# Patient Record
Sex: Male | Born: 1980 | Race: Asian | Hispanic: No | Marital: Married | Smoking: Never smoker
Health system: Southern US, Community
[De-identification: ages and names within clinical notes are randomized; demographics above are authoritative.]

## PROBLEM LIST (undated history)

## (undated) HISTORY — PX: RECTAL SURGERY: SHX760

---

## 2017-03-27 ENCOUNTER — Encounter: Payer: Self-pay | Admitting: Family Medicine

## 2017-03-27 ENCOUNTER — Ambulatory Visit (INDEPENDENT_AMBULATORY_CARE_PROVIDER_SITE_OTHER): Payer: 59 | Admitting: Family Medicine

## 2017-03-27 VITALS — BP 100/70 | HR 82 | Ht 68.0 in | Wt 168.0 lb

## 2017-03-27 DIAGNOSIS — Z0000 Encounter for general adult medical examination without abnormal findings: Secondary | ICD-10-CM

## 2017-03-27 NOTE — Patient Instructions (Signed)
Preventive Care 18-39 Years, Male Preventive care refers to lifestyle choices and visits with your health care provider that can promote health and wellness. What does preventive care include?  A yearly physical exam. This is also called an annual well check.  Dental exams once or twice a year.  Routine eye exams. Ask your health care provider how often you should have your eyes checked.  Personal lifestyle choices, including: ? Daily care of your teeth and gums. ? Regular physical activity. ? Eating a healthy diet. ? Avoiding tobacco and drug use. ? Limiting alcohol use. ? Practicing safe sex. What happens during an annual well check? The services and screenings done by your health care provider during your annual well check will depend on your age, overall health, lifestyle risk factors, and family history of disease. Counseling Your health care provider may ask you questions about your:  Alcohol use.  Tobacco use.  Drug use.  Emotional well-being.  Home and relationship well-being.  Sexual activity.  Eating habits.  Work and work environment.  Screening You may have the following tests or measurements:  Height, weight, and BMI.  Blood pressure.  Lipid and cholesterol levels. These may be checked every 5 years starting at age 20.  Diabetes screening. This is done by checking your blood sugar (glucose) after you have not eaten for a while (fasting).  Skin check.  Hepatitis C blood test.  Hepatitis B blood test.  Sexually transmitted disease (STD) testing.  Discuss your test results, treatment options, and if necessary, the need for more tests with your health care provider. Vaccines Your health care provider may recommend certain vaccines, such as:  Influenza vaccine. This is recommended every year.  Tetanus, diphtheria, and acellular pertussis (Tdap, Td) vaccine. You may need a Td booster every 10 years.  Varicella vaccine. You may need this if you  have not been vaccinated.  HPV vaccine. If you are 26 or younger, you may need three doses over 6 months.  Measles, mumps, and rubella (MMR) vaccine. You may need at least one dose of MMR.You may also need a second dose.  Pneumococcal 13-valent conjugate (PCV13) vaccine. You may need this if you have certain conditions and have not been vaccinated.  Pneumococcal polysaccharide (PPSV23) vaccine. You may need one or two doses if you smoke cigarettes or if you have certain conditions.  Meningococcal vaccine. One dose is recommended if you are age 19-21 years and a first-year college student living in a residence hall, or if you have one of several medical conditions. You may also need additional booster doses.  Hepatitis A vaccine. You may need this if you have certain conditions or if you travel or work in places where you may be exposed to hepatitis A.  Hepatitis B vaccine. You may need this if you have certain conditions or if you travel or work in places where you may be exposed to hepatitis B.  Haemophilus influenzae type b (Hib) vaccine. You may need this if you have certain risk factors.  Talk to your health care provider about which screenings and vaccines you need and how often you need them. This information is not intended to replace advice given to you by your health care provider. Make sure you discuss any questions you have with your health care provider. Document Released: 08/05/2001 Document Revised: 02/27/2016 Document Reviewed: 04/10/2015 Elsevier Interactive Patient Education  2017 [employer] Inc[employer]  

## 2017-03-27 NOTE — Progress Notes (Signed)
   Subjective:  Jordan Huber is a 36 y.o. male who presents today for his annual comprehensive physical exam.    HPI:  Jordan Huber has no acute complaints today.   Lifestyle Diet: Balanced. Plenty of fruits and vegetables.  Exercise: Plays tennis and walks regularly.   Depression screen PHQ 2/9 03/27/2017  Decreased Interest 0  Down, Depressed, Hopeless 0  PHQ - 2 Score 0    Health Maintenance Due  Topic Date Due  . HIV Screening  12/06/1994     ROS: Decreased sleep related to his work. Otherwise a 14 point review of systems was performed and was negative  PMH:  The following were reviewed and entered/updated in epic: History reviewed. No pertinent past medical history. There are no active problems to display for this patient.  History reviewed. No pertinent surgical history.  History reviewed. No pertinent family history.  Medications- reviewed and updated No current outpatient prescriptions on file.   No current facility-administered medications for this visit.     Allergies-reviewed and updated Not on File  Social History   Social History  . Marital status: Married    Spouse name: N/A  . Number of children: 1  . Years of education: N/A   Occupational History  .      IT software   Social History Main Topics  . Smoking status: Never Smoker  . Smokeless tobacco: Never Used  . Alcohol use No  . Drug use: No  . Sexual activity: Not Asked   Other Topics Concern  . None   Social History Narrative  . None    Objective:  Physical Exam: BP 100/70   Pulse 82   Ht 5\' 8"  (1.727 m)   Wt 168 lb (76.2 kg)   SpO2 98%   BMI 25.54 kg/m   Body mass index is 25.54 kg/m. Gen: NAD, resting comfortably HEENT: TMs normal bilaterally. OP clear. No thyromegaly noted.  CV: RRR with no murmurs appreciated Pulm: NWOB, CTAB with no crackles, wheezes, or rhonchi GI: Normal bowel sounds present. Soft, Nontender, Nondistended. MSK: no edema, cyanosis, or  clubbing noted Skin: warm, dry Neuro: CN2-12 grossly intact. Strength 5/5 in upper and lower extremities. Reflexes symmetric and intact bilaterally.  Psych: Normal affect and thought content  Assessment/Plan:  Preventative Healthcare: Flu shot deferred today. Patient reports that he recently had his cholesterol levels checked. Asked patient to bring these records in.  Patient Counseling:  -Nutrition: Stressed importance of moderation in sodium/caffeine intake, saturated fat and cholesterol, caloric balance, sufficient intake of fresh fruits, vegetables, and fiber.  -Stressed the importance of regular exercise.   -Substance Abuse: Discussed cessation/primary prevention of tobacco, alcohol, or other drug use; driving or other dangerous activities under the influence; availability of treatment for abuse.   -Injury prevention: Discussed safety belts, safety helmets, smoke detector, smoking near bedding or upholstery.   -Sexuality: Discussed sexually transmitted diseases, partner selection, use of condoms, avoidance of unintended pregnancy and contraceptive alternatives.   -Dental health: Discussed importance of regular tooth brushing, flossing, and dental visits.  -Health maintenance and immunizations reviewed. Please refer to Health maintenance section.  Return to care in 1 year for next preventative visit.   Algis Greenhouse. Jerline Pain, MD 03/27/2017 2:50 PM

## 2018-03-05 ENCOUNTER — Encounter: Payer: Self-pay | Admitting: Family Medicine

## 2018-03-05 ENCOUNTER — Ambulatory Visit (INDEPENDENT_AMBULATORY_CARE_PROVIDER_SITE_OTHER): Payer: 59 | Admitting: Family Medicine

## 2018-03-05 VITALS — BP 108/60 | HR 72 | Temp 98.3°F | Ht 68.0 in | Wt 172.4 lb

## 2018-03-05 DIAGNOSIS — S29011A Strain of muscle and tendon of front wall of thorax, initial encounter: Secondary | ICD-10-CM

## 2018-03-05 MED ORDER — MELOXICAM 15 MG PO TABS: 15.0000 mg | ORAL_TABLET | Freq: Every day | ORAL | 0 refills | Status: DC

## 2018-03-05 NOTE — Patient Instructions (Signed)
Take mobic once/day for the first 3 days then as needed. Continue with heating pads. If not getting better please let me know on Monday so I can have our sports medicine doctor see you.    Muscle Strain A muscle strain (pulled muscle) happens when a muscle is stretched beyond normal length. It happens when a sudden, violent force stretches your muscle too far. Usually, a few of the fibers in your muscle are torn. Muscle strain is common in athletes. Recovery usually takes 1-2 weeks. Complete healing takes 5-6 weeks. Follow these instructions at home:  Follow the PRICE method of treatment to help your injury get better. Do this the first 2-3 days after the injury: ? Protect. Protect the muscle to keep it from getting injured again. ? Rest. Limit your activity and rest the injured body part. ? Ice. Put ice in a plastic bag. Place a towel between your skin and the bag. Then, apply the ice and leave it on from 15-20 minutes each hour. After the third day, switch to moist heat packs. ? Compression. Use a splint or elastic bandage on the injured area for comfort. Do not put it on too tightly. ? Elevate. Keep the injured body part above the level of your heart.  Only take medicine as told by your doctor.  Warm up before doing exercise to prevent future muscle strains. Contact a doctor if:  You have more pain or puffiness (swelling) in the injured area.  You feel numbness, tingling, or notice a loss of strength in the injured area. This information is not intended to replace advice given to you by your health care provider. Make sure you discuss any questions you have with your health care provider. Document Released: 03/18/2008 Document Revised: 11/15/2015 Document Reviewed: 01/06/2013 Elsevier Interactive Patient Education  2017 [employer].

## 2018-03-05 NOTE — Progress Notes (Signed)
Patient: Jordan Huber MRN: [id number] DOB: 08-23-1980 PCP: Vivi Barrack, MD     Subjective:  Chief Complaint  Patient presents with  . left shoulder pain    x 3 days    HPI: The patient is a 37 y.o. male who presents today for left shoulder pain that started 3 days ago. He does not recall any precipitating factor. He denies any heavy lifting and uses treadmill, but nothing out of the ordinary. He works in Engineer, technical sales. He is right handed. Pain is over anterior left shoulder and superior aspect of shoulder joint with some radiation into his pec. Pain described as stabbing with movement and sharp. He states it has gotten better over the past 2 days. No otc medication has been tried. He states a heating pad relieved the pain. Lifting and turning the shoulder make it worse.   Review of Systems  Respiratory: Negative for shortness of breath.   Cardiovascular: Negative for chest pain.  Musculoskeletal: Positive for arthralgias and myalgias.       Left shoulder pain no h/o injury  Left pectoral muscle sore     Allergies Patient has No Known Allergies.  Past Medical History Patient  has no past medical history on file.  Surgical History Patient  has no past surgical history on file.  Family History Pateint's family history is not on file.  Social History Patient  reports that he has never smoked. He has never used smokeless tobacco. He reports that he does not drink alcohol or use drugs.    Objective: Vitals:   03/05/18 0821  BP: 108/60  Pulse: 72  Temp: 98.3 F (36.8 C)  TempSrc: Oral  SpO2: 97%  Weight: 172 lb 6.4 oz (78.2 kg)  Height: 5\' 8"  (1.727 m)    Body mass index is 26.21 kg/m.  Physical Exam  Constitutional: He appears well-developed and well-nourished.  Neck: Normal range of motion. Neck supple.  Cardiovascular: Normal rate, regular rhythm and normal heart sounds.  Pulmonary/Chest: Effort normal and breath sounds normal.  Musculoskeletal:  TTP over lateral  aspect of left pectoralis and into proximal/superior aspect of left humerus just adjacent to shoulder joint. Negative neer's sign. Pain in previously mentioned places with gerber test. Some pain with passive arc test. ROM intact. No erythema/edema.   Vitals reviewed.      Assessment/plan: 1. Pectoralis muscle strain, initial encounter Appears to have a pectoralis strain. RICe/conservative therapy. We are going to do scheduled mobic for next 3-5 days then prn. Continue with heat. No heavy lifting. If not making significant progress by next week he is to let us know so we can get him in to see sports med.     Return if symptoms worsen or fail to improve.    Orma Flaming, MD [place]   03/05/2018

## 2018-10-12 ENCOUNTER — Ambulatory Visit (INDEPENDENT_AMBULATORY_CARE_PROVIDER_SITE_OTHER): Payer: 59 | Admitting: Family Medicine

## 2018-10-12 ENCOUNTER — Encounter: Payer: Self-pay | Admitting: Family Medicine

## 2018-10-12 DIAGNOSIS — L0291 Cutaneous abscess, unspecified: Secondary | ICD-10-CM

## 2018-10-12 MED ORDER — DOXYCYCLINE HYCLATE 100 MG PO TABS: 100.0000 mg | ORAL_TABLET | Freq: Two times a day (BID) | ORAL | 0 refills | Status: DC

## 2018-10-12 NOTE — Progress Notes (Signed)
    Chief Complaint:  Jordan Huber is a 38 y.o. male who presents today for a virtual office visit with a chief complaint of Skin Infection.   Assessment/Plan:  Abscess No red flags.  No signs of Fournier's gangrene or systemic infection.  Will start doxycycline 100 mg twice daily x7 days.  Continue warm compresses.  Encourage good oral hydration.  Discussed warning signs and reasons to return to care.  Follow-up as needed.    Subjective:  HPI:  Skin Infection  Started about a week ago. Located near gluteal area.  Has tried using warm baths and hot water with some improvement.  Charted having bloody and purulent drainage 2 days ago and has had some improvement in pain since then.  Symptoms again worsened this morning with pain and purulent drainage.  No fevers or chills.  No other treatments tried.  No body aches, nausea, or vomiting.  No urinary symptoms.  No other obvious alleviating or aggravating factors.  ROS: Per HPI  PMH: He reports that he has never smoked. He has never used smokeless tobacco. He reports that he does not drink alcohol or use drugs.      Objective/Observations  Physical Exam: Gen: NAD, resting comfortably Pulm: Normal work of breathing Neuro: Grossly normal, moves all extremities Psych: Normal affect and thought content GU: Erythematous area approximately 2 cm in diameter on left gluteal cleft.  Virtual Visit via Video   I connected with Ravidabu Ruppe on 10/12/18 at  4:00 PM EDT by a video enabled telemedicine application and verified that I am speaking with the correct person using two identifiers. I discussed the limitations of evaluation and management by telemedicine and the availability of in person appointments. The patient expressed understanding and agreed to proceed.   Patient location: Home Provider location: [place] participating in the virtual visit: Myself and Patient     Algis Greenhouse. Jerline Pain, MD 10/12/2018 3:36  PM

## 2018-11-04 ENCOUNTER — Ambulatory Visit (INDEPENDENT_AMBULATORY_CARE_PROVIDER_SITE_OTHER): Payer: 59 | Admitting: Family Medicine

## 2018-11-04 ENCOUNTER — Encounter: Payer: Self-pay | Admitting: Family Medicine

## 2018-11-04 DIAGNOSIS — L0291 Cutaneous abscess, unspecified: Secondary | ICD-10-CM

## 2018-11-04 MED ORDER — DOXYCYCLINE HYCLATE 100 MG PO TABS: 100.0000 mg | ORAL_TABLET | Freq: Two times a day (BID) | ORAL | 0 refills | Status: DC

## 2018-11-04 NOTE — Progress Notes (Signed)
    Chief Complaint:  Jordan Huber is a 38 y.o. male who presents today for a virtual office visit with a chief complaint of abscess.   Assessment/Plan:  Perirectal Abscess No red flags.  Concern for rectal involvement given that he has had some purulent drainage per rectum.  Will restart doxycycline 100 mg twice daily for 14 days.  Will place urgent referral to surgery.  Discussed reasons to return to care and seek emergent care.    Subjective:  HPI:  Abscess Patient was treated with a course of antibiotics about a month ago.  He had some improvement in symptoms however over the last few days has noticed recurrence of drainage from the area.  Also has noted some purulent rectal drainage as well.  No fevers or chills.  No pain.  No body aches.  Some increased lethargy and malaise.  No other treatments tried. No other obvious alleviating or aggravating factors.   ROS: Per HPI  PMH: He reports that he has never smoked. He has never used smokeless tobacco. He reports that he does not drink alcohol or use drugs.      Objective/Observations  Physical Exam: Gen: NAD, resting comfortably Pulm: Normal work of breathing Neuro: Grossly normal, moves all extremities Psych: Normal affect and thought content GU: Erythematous area approximately 2cm in diameter on right anal verge. Open with some purulent drainage.    Virtual Visit via Video   I connected with Jordan Huber on 11/04/18 at  2:20 PM EDT by a video enabled telemedicine application and verified that I am speaking with the correct person using two identifiers. I discussed the limitations of evaluation and management by telemedicine and the availability of in person appointments. The patient expressed understanding and agreed to proceed.   Patient location: Home Provider location: [place] participating in the virtual visit: Myself and Patient     Algis Greenhouse. Jerline Pain, MD 11/04/2018 2:39 PM

## 2019-01-04 ENCOUNTER — Encounter: Payer: Self-pay | Admitting: Physician Assistant

## 2019-01-04 ENCOUNTER — Ambulatory Visit (INDEPENDENT_AMBULATORY_CARE_PROVIDER_SITE_OTHER): Payer: 59 | Admitting: Physician Assistant

## 2019-01-04 ENCOUNTER — Other Ambulatory Visit: Payer: Self-pay

## 2019-01-04 VITALS — BP 100/60 | HR 95 | Temp 98.6°F | Ht 68.0 in | Wt 171.0 lb

## 2019-01-04 DIAGNOSIS — L0291 Cutaneous abscess, unspecified: Secondary | ICD-10-CM | POA: Diagnosis not present

## 2019-01-04 MED ORDER — DOXYCYCLINE HYCLATE 100 MG PO TABS: 100.0000 mg | ORAL_TABLET | Freq: Two times a day (BID) | ORAL | 0 refills | Status: DC

## 2019-01-04 NOTE — Progress Notes (Signed)
Jordan Huber is a 38 y.o. male here for a follow up of a pre-existing problem.  History of Present Illness:   Chief Complaint  Patient presents with  . Follow-up    boil, x3 months/ Rt side of rectum    HPI   Abscess Peri-rectal abscess causing intermittent issues x 3-4 months. He has been on two rounds of antibiotics and it seems to improve after each round. He states that a referral for surgery was recommended by his PCP but he was never contacted for this. He didn't really worry about it because his symptoms greatly improved after his last visit. He states that 2-3 days ago however his symptoms returned. He is having some issues with purulent drainage and pain to the location. Denies: fevers, chills, rectal bleeding.  History reviewed. No pertinent past medical history.   Social History   Socioeconomic History  . Marital status: Married    Spouse name: Not on file  . Number of children: 1  . Years of education: Not on file  . Highest education level: Not on file  Occupational History    Comment: IT software  Social Needs  . Financial resource strain: Not on file  . Food insecurity    Worry: Not on file    Inability: Not on file  . Transportation needs    Medical: Not on file    Non-medical: Not on file  Tobacco Use  . Smoking status: Never Smoker  . Smokeless tobacco: Never Used  Substance and Sexual Activity  . Alcohol use: No  . Drug use: No  . Sexual activity: Not on file  Lifestyle  . Physical activity    Days per week: Not on file    Minutes per session: Not on file  . Stress: Not on file  Relationships  . Social Herbalist on phone: Not on file    Gets together: Not on file    Attends religious service: Not on file    Active member of club or organization: Not on file    Attends meetings of clubs or organizations: Not on file    Relationship status: Not on file  . Intimate partner violence    Fear of current or ex partner: Not on file   Emotionally abused: Not on file    Physically abused: Not on file    Forced sexual activity: Not on file  Other Topics Concern  . Not on file  Social History Narrative  . Not on file    History reviewed. No pertinent surgical history.  History reviewed. No pertinent family history.  No Known Allergies  Current Medications:   Current Outpatient Medications:  .  doxycycline (VIBRA-TABS) 100 MG tablet, Take 1 tablet (100 mg total) by mouth 2 (two) times daily., Disp: 28 tablet, Rfl: 0   Review of Systems:   ROS  Negative unless otherwise specified per HPI.  Vitals:   Vitals:   01/04/19 0948  BP: 100/60  Pulse: 95  Temp: 98.6 F (37 C)  TempSrc: Oral  SpO2: 97%  Weight: 171 lb (77.6 kg)  Height: 5\' 8"  (1.727 m)     Body mass index is 26 kg/m.  Physical Exam:   Physical Exam Vitals signs and nursing note reviewed.  Constitutional:      General: He is not in acute distress.    Appearance: He is well-developed. He is not ill-appearing or toxic-appearing.  Cardiovascular:     Rate and Rhythm: Normal rate  and regular rhythm.     Pulses: Normal pulses.     Heart sounds: Normal heart sounds, S1 normal and S2 normal.     Comments: No LE edema Pulmonary:     Effort: Pulmonary effort is normal.     Breath sounds: Normal breath sounds.  Genitourinary:   Skin:    General: Skin is warm and dry.  Neurological:     Mental Status: He is alert.     GCS: GCS eye subscore is 4. GCS verbal subscore is 5. GCS motor subscore is 6.  Psychiatric:        Speech: Speech normal.        Behavior: Behavior normal. Behavior is cooperative.     Assessment and Plan:   Jordan Huber was seen today for follow-up.  Diagnoses and all orders for this visit:  Abscess  Other orders -     doxycycline (VIBRA-TABS) 100 MG tablet; Take 1 tablet (100 mg total) by mouth 2 (two) times daily.   Doxycycline prescribed due to recent recurrence of symptoms. Referral put in by PCP at prior  visit, instructed patient to call [facility], phone number provided. I recommended that he reach out to our office if he has difficulty scheduling appointment and we will assist as able. Worsening precautions advised.  . Reviewed expectations re: course of current medical issues. . Discussed self-management of symptoms. . Outlined signs and symptoms indicating need for more acute intervention. . Patient verbalized understanding and all questions were answered. . See orders for this visit as documented in the electronic medical record. . Patient received an After-Visit Summary.  Inda Coke, PA-C

## 2019-01-04 NOTE — Patient Instructions (Signed)
Start oral doxycycline antibiotic.  Please call [facility] to schedule an appointment: [phone].  If you have difficulty getting an appointment, please call us and let us know and we will try to help.   Anorectal Abscess An abscess is an infected area that contains a collection of pus. An anorectal abscess is an abscess that is near the opening of the anus or around the rectum. Without treatment, an anorectal abscess can become larger and cause other problems, such as a more serious body-wide infection or pain, especially during bowel movements. What are the causes? This condition is caused by plugged glands or an infection in one of these areas:  The anus.  The area between the anus and the scrotum in males or between the anus and the vagina in females (perineum). What increases the risk? The following factors may make you more likely to develop this condition:  Diabetes or inflammatory bowel disease.  Having a body defense system (immune system) that is weak.  Engaging in anal sex.  Having a sexually transmitted infection (STI).  Certain kinds of cancer, such as rectal carcinoma, leukemia, or lymphoma. What are the signs or symptoms? The main symptom of this condition is pain. The pain may be a throbbing pain that gets worse during bowel movements. Other symptoms include:  Swelling and redness in the area of the abscess. The redness may go beyond the abscess and appear as a red streak on the skin.  A visible, painful lump, or a lump that can be felt when touched.  Bleeding or pus-like discharge from the area.  Fever.  General weakness.  Constipation.  Diarrhea. How is this diagnosed? This condition is diagnosed based on your medical history and a physical exam of the affected area.  This may involve examining the rectal area with a gloved hand (digital rectal exam).  Sometimes, the health care provider needs to look into the rectum using a probe,  scope, or imaging test.  For women, it may require a careful vaginal exam. How is this treated? Treatment for this condition may include:  Incision and drainage surgery. This involves making an incision over the abscess to drain the pus.  Medicines, including antibiotic medicine, pain medicine, stool softeners, or laxatives. Follow these instructions at home: Medicines  Take over-the-counter and prescription medicines only as told by your health care provider.  If you were prescribed an antibiotic medicine, use it as told by your health care provider. Do not stop using the antibiotic even if you start to feel better.  Do not drive or use heavy machinery while taking prescription pain medicine. Wound care   If gauze was used in the abscess, follow instructions from your health care provider about removing or changing the gauze. It can usually be removed in 2-3 days.  Wash your hands with soap and water before you remove or change your gauze. If soap and water are not available, use hand sanitizer.  If one or more drains were placed in the abscess cavity, be careful not to pull at them. Your health care provider will tell you how long they need to remain in place.  Check your incision area every day for signs of infection. Check for: ? More redness, swelling, or pain. ? More fluid or blood. ? Warmth. ? Pus or a bad smell. Managing pain, stiffness, and swelling   Take a sitz bath 3-4 times a day and after bowel movements. This will help reduce pain and swelling.  To  relieve pain, try sitting: ? On a heating pad with the setting on low. ? On an inflatable donut-shaped cushion.  If directed, put ice on the affected area: ? Put ice in a plastic bag. ? Place a towel between your skin and the bag. ? Leave the ice on for 20 minutes, 2-3 times a day. General instructions  Follow any diet instructions given by your health care provider.  Keep all follow-up visits as told by your  health care provider. This is important. Contact a health care provider if you have:  Bleeding from your incision.  Pain, swelling, or redness that does not improve or gets worse.  Trouble passing stool or urine.  Symptoms that return after treatment. Get help right away if you:  Have problems moving or using your legs.  Have severe or increasing pain.  Have swelling in the affected area that suddenly gets worse.  Have a large increase in bleeding or passing of pus.  Develop chills or a fever. Summary  An anorectal abscess is an abscess that is near the opening of the anus or around the rectum. An abscess is an infected area that contains a collection of pus.  The main symptom of this condition is pain. It may be a throbbing pain that gets worse during bowel movements.  Treatment for an anorectal abscess may include surgery to drain the pus from the abscess. Medicines and sitz baths may also be a part of your treatment plan. This information is not intended to replace advice given to you by your health care provider. Make sure you discuss any questions you have with your health care provider. Document Released: 06/06/2000 Document Revised: 07/16/2017 Document Reviewed: 07/16/2017 Elsevier Patient Education  2020 [employer].

## 2019-01-11 ENCOUNTER — Ambulatory Visit: Payer: Self-pay | Admitting: Surgery

## 2019-01-11 ENCOUNTER — Encounter: Payer: Self-pay | Admitting: Surgery

## 2019-01-11 DIAGNOSIS — K603 Anal fistula: Secondary | ICD-10-CM | POA: Insufficient documentation

## 2019-01-11 NOTE — H&P (Signed)
Jordan Huber Documented: 01/11/2019 8:46 AM Location: [facility] Patient #: [id number] DOB: 1981-01-10 Married / Language: English / Race: Asian Male   History of Present Illness Jordan Hector MD; 01/11/2019 10:23 AM) The patient is a 38 year old male who presents with anal fistula. Note for "Anal fistula": ` ` ` ` ` ` Patient sent for surgical consultation at the request of Inda Coke, [place],  Chief Complaint: Recurrent perirectal abscesses. Probable fistula. ` ` The patient is a young male the gets recurrent boils near his anus. It happens every 3-4 months. Usually he gets antibiotics and it goes away. However they keep coming back. He's been to the primary care office in April, May, and last week. Discuss with his primary care office. Surgical consultation offered. Usually moves his bowels once a day. Sometimes twice. With these flares he's had up to 4 bowel movements a day, especially on the Augmentin antibiotic. He's never had a colonoscopy. Usually walks about 45 minutes in the morning. Does not smoke. He is not diabetic. No hypertension. [place].  No personal nor family history of GI/colon cancer, inflammatory bowel disease, irritable bowel syndrome, allergy such as Celiac Sprue, dietary/dairy problems, colitis, ulcers nor gastritis. No recent sick contacts/gastroenteritis. No travel outside the country. No changes in diet. No dysphagia to solids or liquids. No significant heartburn or reflux. No hematochezia, hematemesis, coffee ground emesis. No evidence of prior gastric/peptic ulceration.  (Review of systems as stated in this history (HPI) or in the review of systems. Otherwise all other 12 point ROS are negative) ` ` `   Past Surgical History Nance Pew, CMA; 01/11/2019 8:46 AM) No pertinent past surgical history   Diagnostic Studies History Nance Pew, CMA; 01/11/2019 8:46 AM) Colonoscopy   never  Allergies ([place], CMA; 01/11/2019 8:47 AM) No Known Drug Allergies  [01/04/2019]: Allergies Reconciled   Medication History Nance Pew, CMA; 01/11/2019 8:48 AM) Medications Reconciled Amoxicillin-Pot Clavulanate (875MG  Tablet, Oral) Active.  Social History Gabriel Cirri [place], CMA; 01/11/2019 8:46 AM) Caffeine use  Tea. No alcohol use  No drug use  Tobacco use  Never smoker.  Family History Nance Pew, [place]; 01/11/2019 8:46 AM) First Degree Relatives  No pertinent family history   Other Problems Nance Pew, Jordan; 01/11/2019 8:46 AM) No pertinent past medical history     Review of Systems Hunt Oris; 01/11/2019 8:50 AM) General Not Present- Appetite Loss, Chills, Fatigue, Fever, Night Sweats, Weight Gain and Weight Loss. Skin Not Present- Change in Wart/Mole, Dryness, Hives, Jaundice, New Lesions, Non-Healing Wounds, Rash and Ulcer. HEENT Not Present- Earache, Hearing Loss, Hoarseness, Nose Bleed, Oral Ulcers, Ringing in the Ears, Seasonal Allergies, Sinus Pain, Sore Throat, Visual Disturbances, Wears glasses/contact lenses and Yellow Eyes. Respiratory Not Present- Bloody sputum, Chronic Cough, Difficulty Breathing, Snoring and Wheezing. Breast Not Present- Breast Mass, Breast Pain, Nipple Discharge and Skin Changes. Cardiovascular Not Present- Chest Pain, Difficulty Breathing Lying Down, Leg Cramps, Palpitations, Rapid Heart Rate, Shortness of Breath and Swelling of Extremities. Gastrointestinal Present- Bloating and Excessive gas. Not Present- Abdominal Pain, Bloody Stool, Change in Bowel Habits, Chronic diarrhea, Constipation, Difficulty Swallowing, Gets full quickly at meals, Hemorrhoids, Indigestion, Nausea, Rectal Pain and Vomiting. Male Genitourinary Not Present- Blood in Urine, Change in Urinary Stream, Frequency, Impotence, Nocturia, Painful Urination, Urgency and Urine Leakage. Musculoskeletal Not Present- Back Pain, Joint Pain, Joint Stiffness,  Muscle Pain, Muscle Weakness and Swelling of Extremities. Neurological Not Present- Decreased Memory, Fainting, Headaches, Numbness, Seizures, Tingling, Tremor, Trouble  walking and Weakness. Psychiatric Not Present- Anxiety, Bipolar, Change in Sleep Pattern, Depression, Fearful and Frequent crying. Endocrine Not Present- Cold Intolerance, Excessive Hunger, Hair Changes, Heat Intolerance, Hot flashes and New Diabetes. Hematology Not Present- Blood Thinners, Easy Bruising, Excessive bleeding, Gland problems, HIV and Persistent Infections.  Vitals (Sabrina Canty CMA; 01/11/2019 8:48 AM) 01/11/2019 8:48 AM Weight: 173.8 lb Height: 68in Body Surface Area: 1.93 m Body Mass Index: 26.43 kg/m  Temp.: 97.72F (Oral)  Pulse: 98 (Regular)  BP: 142/68(Sitting, Left Arm, Standard)       Physical Exam Jordan Hector MD; 01/11/2019 9:00 AM) General Mental Status-Alert. General Appearance-Not in acute distress, Not Sickly. Orientation-Oriented X3. Hydration-Well hydrated. Voice-Normal.  Integumentary Global Assessment Upon inspection and palpation of skin surfaces of the - Axillae: non-tender, no inflammation or ulceration, no drainage. and Distribution of scalp and body hair is normal. General Characteristics Temperature - normal warmth is noted.  Head and Neck Head-normocephalic, atraumatic with no lesions or palpable masses. Face Global Assessment - atraumatic, no absence of expression. Neck Global Assessment - no abnormal movements, no bruit auscultated on the right, no bruit auscultated on the left, no decreased range of motion, non-tender. Trachea-midline. Thyroid Gland Characteristics - non-tender.  Eye Eyeball - Left-Extraocular movements intact, No Nystagmus - Left. Eyeball - Right-Extraocular movements intact, No Nystagmus - Right. Cornea - Left-No Hazy - Left. Cornea - Right-No Hazy - Right. Sclera/Conjunctiva - Left-No scleral icterus, No  Discharge - Left. Sclera/Conjunctiva - Right-No scleral icterus, No Discharge - Right. Pupil - Left-Direct reaction to light normal. Pupil - Right-Direct reaction to light normal.  ENMT Ears Pinna - Left - no drainage observed, no generalized tenderness observed. Pinna - Right - no drainage observed, no generalized tenderness observed. Nose and Sinuses External Inspection of the Nose - no destructive lesion observed. Inspection of the nares - Left - quiet respiration. Inspection of the nares - Right - quiet respiration. Mouth and Throat Lips - Upper Lip - no fissures observed, no pallor noted. Lower Lip - no fissures observed, no pallor noted. Nasopharynx - no discharge present. Oral Cavity/Oropharynx - Tongue - no dryness observed. Oral Mucosa - no cyanosis observed. Hypopharynx - no evidence of airway distress observed.  Chest and Lung Exam Inspection Movements - Normal and Symmetrical. Accessory muscles - No use of accessory muscles in breathing. Palpation Palpation of the chest reveals - Non-tender. Auscultation Breath sounds - Normal and Clear.  Cardiovascular Auscultation Rhythm - Regular. Murmurs & Other Heart Sounds - Auscultation of the heart reveals - No Murmurs and No Systolic Clicks.  Abdomen Inspection Inspection of the abdomen reveals - No Visible peristalsis and No Abnormal pulsations. Umbilicus - No Bleeding, No Urine drainage. Palpation/Percussion Palpation and Percussion of the abdomen reveal - Soft, Non Tender, No Rebound tenderness, No Rigidity (guarding) and No Cutaneous hyperesthesia. Note: Abdomen soft. Nontender. Not distended. No umbilical or incisional hernias. No guarding.   Male Genitourinary Sexual Maturity Tanner 5 - Adult hair pattern and Adult penile size and shape. Note: No inguinal hernias. Normal external genitalia. Epididymi, testes, and spermatic cords normal without any masses.   Rectal Note: Please refer to anoscopy  section.  Posterior midline and left posterior lateral sinuses to-3 cm from anal verge suspicious or perirectal fistula. Definite cord felt to the posterior sphincter complex. Tolerated digital and anoscopic exam. No fissure. No active abscess. Grade 2 internal hemorrhoids right posterior and right anterior. Left lateral rate 1. No tumor mass. Prostate smooth. No proctitis. No pilonidal disease.  No severe pruritus.   Peripheral Vascular Upper Extremity Inspection - Left - No Cyanotic nailbeds - Left, Not Ischemic. Inspection - Right - No Cyanotic nailbeds - Right, Not Ischemic.  Neurologic Neurologic evaluation reveals -normal attention span and ability to concentrate, able to name objects and repeat phrases. Appropriate fund of knowledge , normal sensation and normal coordination. Mental Status Affect - not angry, not paranoid. Cranial Nerves-Normal Bilaterally. Gait-Normal.  Neuropsychiatric Mental status exam performed with findings of-able to articulate well with normal speech/language, rate, volume and coherence, thought content normal with ability to perform basic computations and apply abstract reasoning and no evidence of hallucinations, delusions, obsessions or homicidal/suicidal ideation.  Musculoskeletal Global Assessment Spine, Ribs and Pelvis - no instability, subluxation or laxity. Right Upper Extremity - no instability, subluxation or laxity.  Lymphatic Head & Neck  General Head & Neck Lymphatics: Bilateral - Description - No Localized lymphadenopathy. Axillary  General Axillary Region: Bilateral - Description - No Localized lymphadenopathy. Femoral & Inguinal  Generalized Femoral & Inguinal Lymphatics: Left - Description - No Localized lymphadenopathy. Right - Description - No Localized lymphadenopathy.   Results Jordan Hector MD; 01/11/2019 9:13 AM) Procedures  Name Value Date Hemorrhoids Procedure Internal exam: Internal Hemorroids (  non-bleeding) Other: Posterior midline and left posterior lateral sinuses 2-3 cm from anal verge suspicious or perirectal fistula. Definite cord felt to the posterior sphincter complex. ......  ...... Tolerated digital and anoscopic exam. No fissure. No active abscess. Grade 2 internal hemorrhoids right posterior and right anterior. Left lateral rate 1. No tumor mass. Prostate smooth. No proctitis. No pilonidal disease. No severe pruritus.  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 I suspect this is at least intersphincteric is a does not feel very superficial.  Recommended examination under anesthesia with surgical repair of the fistula. Expect I will need to do a lift approach. Seton less likely. He does have 2 sinuses most likely a and excised the whole region. My suspicion for Crohn's is rather low in him at this time. No family history.  He has numerous. Appropriate questions. He is interested in proceeding.  Current Plans You are being scheduled for surgery- Our schedulers will call you.  You should hear from our office's scheduling department within 5 working days about the location, date, and time of surgery. We try to make accommodations for patient's preferences in scheduling surgery, but sometimes the OR schedule or the surgeon's schedule prevents [place] from making those accommodations.  If you have not heard from our office [phone]) in 5 working days, call the office and ask for your surgeon's nurse.  If you have other questions about your diagnosis, plan, or surgery, call the office and ask for your surgeon's nurse.  Pt Education - [facility] Abscess/Fistula (AT): discussed with patient and provided information. ANOSCOPY, DIAGNOSTIC (46600)  ENCOUNTER FOR PREOPERATIVE EXAMINATION FOR GENERAL SURGICAL  PROCEDURE (B34.193)  Current Plans The anatomy & physiology of the anorectal region was discussed. We discussed the pathophysiology of anorectal abscess and fistula. Differential diagnosis was discussed. Natural history progression was discussed. I stressed the importance of a bowel regimen to have daily soft bowel movements to minimize progression of disease.  The patient's condition is not adequately controlled. Non-operative treatment has not healed the fistula. Therefore, I recommended examination under anaesthesia to confirm the diagnosis and treat the fistula. I discussed  techniques that may be required such as fistulotomy, ligation by LIFT technique, and/or seton placement. Benefits & alternatives discussed. I noted a good likelihood this will help address the problem, but sometimes repeat operations and prolonged healing times may occur. Risks such as bleeding, pain, recurrence, reoperation, incontinence, heart attack, death, and other risks were discussed.  Educational handouts further explaining the pathology, treatment options, and bowel regimen were given. The patient expressed understanding & wishes to proceed. We will work to coordinate surgery for a mutually convenient time.  Pt Education - [facility] Rectal Prep for Anorectal outpatient/office surgery: discussed with patient and provided information. Pt Education - [facility] Rectal Surgery HCI (Calleen Alvis): discussed with patient and provided information. Pt Education - [facility] Good Bowel Health ([employer] [place])  Jordan Hector, MD, FACS, MASCRS Gastrointestinal and Minimally Invasive Surgery    [address] [place], [place] [address] [phone] Main / Paging [phone] Fax

## 2019-01-19 ENCOUNTER — Telehealth: Payer: Self-pay | Admitting: Physical Therapy

## 2019-01-19 NOTE — Telephone Encounter (Signed)
Copied from [place] [phone]. Topic: General - Other >> Jan 19, 2019 12:47 PM Richardo Priest, NT wrote: Patient would like to have a call back today in regards to getting a second opinion of him having surgery. He is wanting his surgery to be sooner than September. Please advise and Call back is [phone].

## 2019-01-20 NOTE — Telephone Encounter (Signed)
Jordan Huber, please see message.

## 2019-01-20 NOTE — Telephone Encounter (Signed)
Spoke to patient - he would like a referral for [facility] Surgery for a second opinion.  He was hard to understand, but he mentioned that he didn't details/information that he wanted.  I did tell him to call [facility] and they can answer any questions he may have. He would also like the surgery sooner than Sept - I did explain that with the second referral, his surgery would definitely not be any sooner.  I am still confused as to why exactly he wants the second opinion, he wouldn't go into a lot of detail, but he is adamant about wanting the second opinion.  He is aware that Dr Jerline Pain is out of the office this week.

## 2019-01-24 ENCOUNTER — Other Ambulatory Visit: Payer: Self-pay

## 2019-01-24 DIAGNOSIS — L0291 Cutaneous abscess, unspecified: Secondary | ICD-10-CM

## 2019-01-24 DIAGNOSIS — K603 Anal fistula: Secondary | ICD-10-CM

## 2019-01-24 NOTE — Telephone Encounter (Signed)
Urgent referral to [facility] has been placed.

## 2019-02-01 ENCOUNTER — Telehealth: Payer: Self-pay

## 2019-02-01 NOTE — Telephone Encounter (Signed)
Noted  

## 2019-02-01 NOTE — Telephone Encounter (Signed)
Copied from [place] [phone]. Topic: Quick Communication - See Telephone Encounter >> Feb 01, 2019 11:04 AM Loma [place] wrote: CRM for notification. See Telephone encounter for: 02/01/19. Pt wanted to let DrParker know that he is going to let [facility] do his surgery and that he does not need a referral, will let him know the outcome.

## 2019-06-14 ENCOUNTER — Ambulatory Visit (INDEPENDENT_AMBULATORY_CARE_PROVIDER_SITE_OTHER): Payer: Managed Care, Other (non HMO) | Admitting: Family Medicine

## 2019-06-14 ENCOUNTER — Other Ambulatory Visit: Payer: Self-pay

## 2019-06-14 ENCOUNTER — Encounter: Payer: Self-pay | Admitting: Family Medicine

## 2019-06-14 VITALS — BP 106/68 | HR 67 | Temp 98.0°F | Ht 68.0 in | Wt 156.8 lb

## 2019-06-14 DIAGNOSIS — K603 Anal fistula: Secondary | ICD-10-CM

## 2019-06-14 DIAGNOSIS — Z13220 Encounter for screening for lipoid disorders: Secondary | ICD-10-CM | POA: Diagnosis not present

## 2019-06-14 DIAGNOSIS — Z0001 Encounter for general adult medical examination with abnormal findings: Secondary | ICD-10-CM | POA: Diagnosis not present

## 2019-06-14 LAB — COMPREHENSIVE METABOLIC PANEL
ALT: 15 U/L (ref 0–53)
AST: 17 U/L (ref 0–37)
Albumin: 4.5 g/dL (ref 3.5–5.2)
Alkaline Phosphatase: 82 U/L (ref 39–117)
BUN: 18 mg/dL (ref 6–23)
CO2: 29 mEq/L (ref 19–32)
Calcium: 9.1 mg/dL (ref 8.4–10.5)
Chloride: 103 mEq/L (ref 96–112)
Creatinine, Ser: 0.72 mg/dL (ref 0.40–1.50)
GFR: 121.2 mL/min (ref >60.00)
Glucose, Bld: 85 mg/dL (ref 70–99)
Potassium: 4.2 mEq/L (ref 3.5–5.1)
Sodium: 139 mEq/L (ref 135–145)
Total Bilirubin: 0.4 mg/dL (ref 0.2–1.2)
Total Protein: 6.8 g/dL (ref 6.0–8.3)

## 2019-06-14 LAB — TSH: TSH: 1.29 u[IU]/mL (ref 0.35–4.50)

## 2019-06-14 LAB — LIPID PANEL
Cholesterol: 189 mg/dL (ref 0–200)
HDL: 34.9 mg/dL — ABNORMAL LOW (ref >39.00)
NonHDL: 153.72
Total CHOL/HDL Ratio: 5
Triglycerides: 238 mg/dL — ABNORMAL HIGH (ref 0.0–149.0)
VLDL: 47.6 mg/dL — ABNORMAL HIGH (ref 0.0–40.0)

## 2019-06-14 LAB — CBC
HCT: 41.8 % (ref 39.0–52.0)
Hemoglobin: 14.2 g/dL (ref 13.0–17.0)
MCHC: 34 g/dL (ref 30.0–36.0)
MCV: 84.4 fl (ref 78.0–100.0)
Platelets: 229 10*3/uL (ref 150.0–400.0)
RBC: 4.95 Mil/uL (ref 4.22–5.81)
RDW: 13.4 % (ref 11.5–15.5)
WBC: 5 10*3/uL (ref 4.0–10.5)

## 2019-06-14 LAB — LDL CHOLESTEROL, DIRECT: Direct LDL: 127 mg/dL

## 2019-06-14 NOTE — Progress Notes (Signed)
Chief Complaint:  Jordan Huber is a 38 y.o. male who presents today for his annual comprehensive physical exam.    Assessment/Plan:  Preventative Healthcare: Check CBC, C met, TSH, lipid panel.  Patient Counseling(The following topics were reviewed and/or handout was given):  -Nutrition: Stressed importance of moderation in sodium/caffeine intake, saturated fat and cholesterol, caloric balance, sufficient intake of fresh fruits, vegetables, and fiber.  -Stressed the importance of regular exercise.   -Substance Abuse: Discussed cessation/primary prevention of tobacco, alcohol, or other drug use; driving or other dangerous activities under the influence; availability of treatment for abuse.   -Injury prevention: Discussed safety belts, safety helmets, smoke detector, smoking near bedding or upholstery.   -Sexuality: Discussed sexually transmitted diseases, partner selection, use of condoms, avoidance of unintended pregnancy and contraceptive alternatives.   -Dental health: Discussed importance of regular tooth brushing, flossing, and dental visits.  -Health maintenance and immunizations reviewed. Please refer to Health maintenance section.  Return to care in 1 year for next preventative visit.     Subjective:  HPI:  Jordan Huber has no acute complaints today.   Had surgery for perirectal fistula few months ago.  Tolerated well.  Symptoms have essentially resolved.  Lifestyle Diet: Balanced. Plenty of fruits and vegetables.  Exercise: Likes to walk about 2 miles per day.   Depression screen PHQ 2/9 06/14/2019  Decreased Interest 0  Down, Depressed, Hopeless 0  PHQ - 2 Score 0    Health Maintenance Due  Topic Date Due  . HIV Screening  12/06/1994     ROS: Per HPI, otherwise a complete review of systems was negative.   PMH:  The following were reviewed and entered/updated in epic: History reviewed. No pertinent past medical history. Patient Active Problem List   Diagnosis Date  Noted  . 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 RECTAL SURGERY     abscess    History reviewed. No pertinent family history.  Medications- reviewed and updated No current outpatient medications on file.   No current facility-administered medications for this visit.    Allergies-reviewed and updated No Known Allergies  Social History   Socioeconomic History  . Marital status: Married    Spouse name: Not on file  . Number of children: 1  . Years of education: Not on file  . Highest education level: Not on file  Occupational History    Comment: IT software  Tobacco Use  . Smoking status: Never Smoker  . Smokeless tobacco: Never Used  Substance and Sexual Activity  . Alcohol use: No  . Drug use: No  . Sexual activity: Not on file  Other Topics Concern  . Not on file  Social History Narrative   Originally from [place]   Social Determinants of Health   Financial Resource Strain:   . Difficulty of Paying Living Expenses: Not on file  Food Insecurity:   . Worried About Charity fundraiser in the Last Year: Not on file  . Ran Out of Food in the Last Year: Not on file  Transportation Needs:   . Lack of Transportation (Medical): Not on file  . Lack of Transportation (Non-Medical): Not on file  Physical Activity:   . Days of Exercise per Week: Not on file  . Minutes of Exercise per Session: Not on file  Stress:   . Feeling of Stress : Not on file  Social Connections:   . Frequency of Communication with Friends and Family:  Not on file  . Frequency of Social Gatherings with Friends and Family: Not on file  . Attends Religious Services: Not on file  . Active Member of Clubs or Organizations: Not on file  . Attends Archivist Meetings: Not on file  . Marital Status: Not on file        Objective:  Physical Exam: BP 106/68   Pulse 67   Temp 98 F (36.7 C)   Ht '5\' 8"'  (1.727 m)   Wt 156 lb 12 oz  (71.1 kg)   SpO2 99%   BMI 23.83 kg/m   Body mass index is 23.83 kg/m. Wt Readings from Last 3 Encounters:  06/14/19 156 lb 12 oz (71.1 kg)  01/04/19 171 lb (77.6 kg)  03/05/18 172 lb 6.4 oz (78.2 kg)   Gen: NAD, resting comfortably HEENT: TMs normal bilaterally. OP clear. No thyromegaly noted.  CV: RRR with no murmurs appreciated Pulm: NWOB, CTAB with no crackles, wheezes, or rhonchi GI: Normal bowel sounds present. Soft, Nontender, Nondistended. MSK: no edema, cyanosis, or clubbing noted Skin: warm, dry Neuro: CN2-12 grossly intact. Strength 5/5 in upper and lower extremities. Reflexes symmetric and intact bilaterally.  Psych: Normal affect and thought content     Jordan Huber M. Jerline Pain, MD 06/14/2019 2:49 PM

## 2019-06-14 NOTE — Patient Instructions (Signed)
It was very nice to see you today!  Keep up the good work!  We will check blood work today.  Come back in 1 year for your next checkup, or sooner if needed.  Take care, Dr Jerline Pain  Please try these tips to maintain a healthy lifestyle:   Eat at least 3 REAL meals and 1-2 snacks per day.  Aim for no more than 5 hours between eating.  If you eat breakfast, please do so within one hour of getting up.    Each meal should contain half fruits/vegetables, one quarter protein, and one quarter carbs (no bigger than a computer mouse)   Cut down on sweet beverages. This includes juice, soda, and sweet tea.     Drink at least 1 glass of water with each meal and aim for at least 8 glasses per day   Exercise at least 150 minutes every week.    Preventive Care 47-10 Years Old, Male Preventive care refers to lifestyle choices and visits with your health care provider that can promote health and wellness. This includes:  A yearly physical exam. This is also called an annual well check.  Regular dental and eye exams.  Immunizations.  Screening for certain conditions.  Healthy lifestyle choices, such as eating a healthy diet, getting regular exercise, not using drugs or products that contain nicotine and tobacco, and limiting alcohol use. What can I expect for my preventive care visit? Physical exam Your health care provider will check:  Height and weight. These may be used to calculate body mass index (BMI), which is a measurement that tells if you are at a healthy weight.  Heart rate and blood pressure.  Your skin for abnormal spots. Counseling Your health care provider may ask you questions about:  Alcohol, tobacco, and drug use.  Emotional well-being.  Home and relationship well-being.  Sexual activity.  Eating habits.  Work and work Statistician. What immunizations do I need?  Influenza (flu) vaccine  This is recommended every year. Tetanus, diphtheria, and  pertussis (Tdap) vaccine  You may need a Td booster every 10 years. Varicella (chickenpox) vaccine  You may need this vaccine if you have not already been vaccinated. Human papillomavirus (HPV) vaccine  If recommended by your health care provider, you may need three doses over 6 months. Measles, mumps, and rubella (MMR) vaccine  You may need at least one dose of MMR. You may also need a second dose. Meningococcal conjugate (MenACWY) vaccine  One dose is recommended if you are 27-59 years old and a Market researcher living in a residence hall, or if you have one of several medical conditions. You may also need additional booster doses. Pneumococcal conjugate (PCV13) vaccine  You may need this if you have certain conditions and were not previously vaccinated. Pneumococcal polysaccharide (PPSV23) vaccine  You may need one or two doses if you smoke cigarettes or if you have certain conditions. Hepatitis A vaccine  You may need this if you have certain conditions or if you travel or work in places where you may be exposed to hepatitis A. Hepatitis B vaccine  You may need this if you have certain conditions or if you travel or work in places where you may be exposed to hepatitis B. Haemophilus influenzae type b (Hib) vaccine  You may need this if you have certain risk factors. You may receive vaccines as individual doses or as more than one vaccine together in one shot (combination vaccines). Talk with your health  care provider about the risks and benefits of combination vaccines. What tests do I need? Blood tests  Lipid and cholesterol levels. These may be checked every 5 years starting at age 80.  Hepatitis C test.  Hepatitis B test. Screening   Diabetes screening. This is done by checking your blood sugar (glucose) after you have not eaten for a while (fasting).  Sexually transmitted disease (STD) testing. Talk with your health care provider about your test results,  treatment options, and if necessary, the need for more tests. Follow these instructions at home: Eating and drinking   Eat a diet that includes fresh fruits and vegetables, whole grains, lean protein, and low-fat dairy products.  Take vitamin and mineral supplements as recommended by your health care provider.  Do not drink alcohol if your health care provider tells you not to drink.  If you drink alcohol: ? Limit how much you have to 0-2 drinks a day. ? Be aware of how much alcohol is in your drink. In the [place] one drink equals one 12 oz bottle of beer (355 mL), one 5 oz glass of wine (148 mL), or one 1 oz glass of hard liquor (44 mL). Lifestyle  Take daily care of your teeth and gums.  Stay active. Exercise for at least 30 minutes on 5 or more days each week.  Do not use any products that contain nicotine or tobacco, such as cigarettes, e-cigarettes, and chewing tobacco. If you need help quitting, ask your health care provider.  If you are sexually active, practice safe sex. Use a condom or other form of protection to prevent STIs (sexually transmitted infections). What's next?  Go to your health care provider once a year for a well check visit.  Ask your health care provider how often you should have your eyes and teeth checked.  Stay up to date on all vaccines. This information is not intended to replace advice given to you by your health care provider. Make sure you discuss any questions you have with your health care provider. Document Released: 08/05/2001 Document Revised: 06/03/2018 Document Reviewed: 06/03/2018 Elsevier Patient Education  2020 [employer].

## 2019-06-15 NOTE — Progress Notes (Signed)
Please inform patient of the following:  His "bad" cholesterol is mildly elevated and "good" cholesterol mildly low but everything else is NORMAL. Would like for him to keep work and we can recheck in a year or so.  Jordan Huber. Jerline Pain, MD 06/15/2019 8:15 AM

## 2019-10-20 ENCOUNTER — Ambulatory Visit: Payer: 59

## 2019-10-20 ENCOUNTER — Telehealth: Payer: Self-pay | Admitting: Family Medicine

## 2019-10-20 NOTE — Telephone Encounter (Signed)
Please advise 

## 2019-10-20 NOTE — Telephone Encounter (Signed)
Pt called stating he had surgery for an anal fistula 6 months ago. Pt states he is starting to feel some discomfort again. Pt is asking for Dr. Jerline Pain to refer him to a different specialist. Please advise.

## 2019-10-21 NOTE — Telephone Encounter (Signed)
Left detailed message for patient to call the clinic to scheduled a appt.

## 2019-10-21 NOTE — Telephone Encounter (Signed)
Recommend he have office visit so that we can discuss the most appropriate referral for him.  Algis Greenhouse. Jerline Pain, MD 10/21/2019 9:30 AM

## 2019-10-22 ENCOUNTER — Ambulatory Visit: Payer: 59 | Attending: Internal Medicine

## 2019-10-22 DIAGNOSIS — Z23 Encounter for immunization: Secondary | ICD-10-CM

## 2019-10-22 NOTE — Progress Notes (Signed)
   Covid-19 Vaccination Clinic  Name:  Jordan Huber    MRN: [id number] DOB: 13-Feb-1981  10/22/2019  Mr. Atondo was observed post Covid-19 immunization for 15 minutes without incident. He was provided with Vaccine Information Sheet and instruction to access the V-Safe system.   Mr. Ravens was instructed to call 911 with any severe reactions post vaccine: Marland Kitchen Difficulty breathing  . Swelling of face and throat  . A fast heartbeat  . A bad rash all over body  . Dizziness and weakness   Immunizations Administered    Name Date Dose VIS Date Route   Pfizer COVID-19 Vaccine 10/22/2019  9:24 AM 0.3 mL 08/17/2018 Intramuscular   Manufacturer: [place]   Lot: [id number]   [place]: [id number]

## 2019-10-31 ENCOUNTER — Telehealth (INDEPENDENT_AMBULATORY_CARE_PROVIDER_SITE_OTHER): Payer: 59 | Admitting: Family Medicine

## 2019-10-31 DIAGNOSIS — K6289 Other specified diseases of anus and rectum: Secondary | ICD-10-CM | POA: Diagnosis not present

## 2019-10-31 NOTE — Progress Notes (Signed)
   Jordan Huber is a 39 y.o. male who presents today for a virtual office visit.  Assessment/Plan:  New/Acute Problems: Rectal Huber No red flags.  Low suspicion for recurrent fistula or abscess given that he recently saw his surgeon who told him there was nothing else immediately done from a surgical standpoint.  Possibly could be proctalgia fugax related to surgery late last year.  Will place referral to GI per patient request.  Can use OTC analgesics if needed.     Subjective:  HPI:  Patient presents with persistent rectal Huber for the past several weeks to months.  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  He is followed up with his surgeon who told him there was nothing else needed to be done from a surgical standpoint.  Symptoms seem to be worsened if he does not get enough sleep.  He has not had any issues with constipation or diarrhea.  No fevers or chills.  No melena or hematochezia.       Objective/Observations  Physical Exam: Gen: NAD, resting comfortably Pulm: Normal work of breathing Neuro: Grossly normal, moves all extremities Psych: Normal affect and thought content  Virtual Visit via Video   I connected with Jordan Huber on 10/31/19 at  4:00 PM EDT by a video enabled telemedicine application and verified that I am speaking with the correct person using two identifiers. The limitations of evaluation and management by telemedicine and the availability of in person appointments were discussed. The patient expressed understanding and agreed to proceed.   Patient location: Home Provider location: [place] participating in the virtual visit: Myself and Patient     Jordan Huber. Jordan Pain, MD 10/31/2019 3:54 PM

## 2019-11-02 ENCOUNTER — Encounter: Payer: Self-pay | Admitting: Gastroenterology

## 2019-11-02 ENCOUNTER — Ambulatory Visit (INDEPENDENT_AMBULATORY_CARE_PROVIDER_SITE_OTHER): Payer: 59 | Admitting: Gastroenterology

## 2019-11-02 VITALS — BP 80/60 | HR 72 | Temp 98.0°F | Ht 68.0 in | Wt 157.0 lb

## 2019-11-02 DIAGNOSIS — K6289 Other specified diseases of anus and rectum: Secondary | ICD-10-CM | POA: Diagnosis not present

## 2019-11-02 NOTE — Progress Notes (Signed)
Anal photograph showing area of pain and firmness

## 2019-11-02 NOTE — Progress Notes (Signed)
Referring Provider: Vivi Barrack, MD Primary Care Physician:  Vivi Barrack, MD  Reason for Consultation: Rectal pain   IMPRESSION:  Intersphincteric fistula repair 02/04/19 with ongoing rectal pain LLQ pain x 3 over the last year, associated with eating Internal hemorrhoids  Anorectal pain is in an unusual location for a post-surgical anal stricture. I wonder about recurrent abscess or fistula, inflammatory bowel disease, post-operative neuropathic pain, proctalgia fugax, and coccydynia. No findings of anal fissure in this location. Colonoscopy recommended for further evaluation.   PLAN: Add a daily stool bulking agent such as psyllium or methycellulose Obtain post-operative follow-up records from Dr. Johney Maine Colonoscopy Pelvic MRI  Please see the "Patient Instructions" section for addition details about the plan.  The nature of the procedure, as well as the risks, benefits, and alternatives were carefully and thoroughly reviewed with the patient. Ample time for discussion and questions allowed. The patient understood, was satisfied, and agreed to proceed.  HPI: Jordan Huber is a 39 y.o. male referred by Dr. Jerline Pain for further evaluation of rectal pain.  History is obtained through the patient and review of his electronic health record including some records from his prior evaluation by Dr. Johney Maine.  He is originally from [place]. He works as a Financial planner  Patient had an intersphincteric fistula repair last year for recurrent perirectal abscesses.  He has had persistent rectal pain since that time. Has rectal pain and fullness for 2-3 days followed by approximately a week without symptoms. There is a focal area that he can find that is the center of the pain that is firm during this symptomatic period. This is the same location of his symptoms prior to his surgery. Dr. Johney Maine has offered reassurance on his post-operative follow-up appointment.    No pain on  defecation. No urgency. No diarrhea or constipation.  No blood or mucous in the stool. No history of rectal foreign body or trauma.  Symptoms worsened when he does not get enough sleep.  He notes 2-3 episodes of post-prandial RLQ pain occurring over the last year. Lost 10-12 pounds weight following surgery, and he has been unable to regain the weight.   Endoscopy with Dr. Johney Maine 8/14//2020 showed no fissure, grade 2 internal hemorrhoids right posterior and right anterior, and suspected perirectal fistula.  Likes to walk about 2-3 miles per day.  Eats a diet full of fruits and vegetables.  He has never had a colonoscopy.  No prior abdominal imaging.  No family history of inflammatory bowel disease or autoimmune disease.  No known family history of colon cancer or polyps. No family history of uterine/endometrial cancer, pancreatic cancer or gastric/stomach cancer.   No past medical history on file.  Past Surgical History:  Procedure Laterality Date  . RECTAL SURGERY     abscess    No current outpatient medications on file.   No current facility-administered medications for this visit.    Allergies as of 11/02/2019  . (No Known Allergies)    No family history on file.  Social History   Socioeconomic History  . Marital status: Married    Spouse name: Not on file  . Number of children: 1  . Years of education: Not on file  . Highest education level: Not on file  Occupational History    Comment: IT software  Tobacco Use  . Smoking status: Never Smoker  . Smokeless tobacco: Never Used  Substance and Sexual Activity  . Alcohol use: No  . Drug use:  No  . Sexual activity: Not on file  Other Topics Concern  . Not on file  Social History Narrative   Originally from [place]   Social Determinants of Health   Financial Resource Strain:   . Difficulty of Paying Living Expenses:   Food Insecurity:   . Worried About Charity fundraiser in the Last Year:   . Academic librarian in the Last Year:   Transportation Needs:   . Film/video editor (Medical):   Marland Kitchen Lack of Transportation (Non-Medical):   Physical Activity:   . Days of Exercise per Week:   . Minutes of Exercise per Session:   Stress:   . Feeling of Stress :   Social Connections:   . Frequency of Communication with Friends and Family:   . Frequency of Social Gatherings with Friends and Family:   . Attends Religious Services:   . Active Member of Clubs or Organizations:   . Attends Archivist Meetings:   Marland Kitchen Marital Status:   Intimate Partner Violence:   . Fear of Current or Ex-Partner:   . Emotionally Abused:   Marland Kitchen Physically Abused:   . Sexually Abused:     Review of Systems: 12 system ROS is negative except as noted above.   Physical Exam: General:   Alert,  well-nourished, pleasant and cooperative in NAD Head:  Normocephalic and atraumatic. Eyes:  Sclera clear, no icterus.   Conjunctiva pink. Abdomen:  Soft, nontender, nondistended, normal bowel sounds, no rebound or guarding. No hepatosplenomegaly.   Rectal:   No chemical dermatitis. No external hemorrhoids, fissure or fistula. There is a firm area 3-4 cm posterior to the anus that is firm and tender to the touch. No warmth, fluctuation, or overlying erythema. Internal hemorrhoids are palpacle.  No prolapsing hemorrhoids. No rectal prolapse. Normal anocutaneous reflex. No stool in the rectal vault although a scant flecks of brown stool are at the anus. No mass or fecal impaction. Normal anal resting tone. Chaperone: Peter Congo Msk:  Symmetrical. No boney deformities LAD: No inguinal or umbilical LAD Extremities:  No clubbing or edema. Neurologic:  Alert and  oriented x4;  grossly nonfocal Skin:  Intact without significant lesions or rashes. Psych:  Alert and cooperative. Normal mood and affect.    Britany Callicott L. Tarri Glenn, MD, MPH 11/02/2019, 9:04 AM

## 2019-11-02 NOTE — Patient Instructions (Addendum)
If you are age 40 or older, your body mass index should be between 23-30. Your Body mass index is 23.87 kg/m. If this is out of the aforementioned range listed, please consider follow up with your Primary Care Provider.  If you are age 33 or younger, your body mass index should be between 19-25. Your Body mass index is 23.87 kg/m. If this is out of the aformentioned range listed, please consider follow up with your Primary Care Provider.   You have been scheduled for an MRI at [facility] on 11-14-2019. Your appointment time is 9am. Please arrive 15 minutes prior to your appointment time for registration purposes. Please make certain not to have anything to eat or drink 6 hours prior to your test. In addition, if you have any metal in your body, have a pacemaker or defibrillator, please be sure to let your ordering physician know. This test typically takes 45 minutes to 1 hour to complete. Should you need to reschedule, please call [phone] to do so.  I have recommended a colonoscopy for further evaluation of the rectum and colon. I have also recommended a pelvic MRI for further evaluation of your symptoms.  In the meantime, I recommend a daily stool bulking agent such as psyllium or methylcellulose. Follow a high fiber diet, drink at least 64 ounces of water, and continue to exercise regularly.  Tips for colonoscopy:  - Stay well hydrated for 3-4 days prior to the exam. This reduces nausea and dehydration.  - To prevent skin/hemorrhoid irritation - prior to wiping, put A&Dointment or vaseline on the toilet paper. - Keep a towel or pad on the bed.  - Drink  64oz of clear liquids in the morning of prep day (prior to starting the prep) to be sure that there is enough fluid to flush the colon and stay hydrated!!!! This is in addition to the fluids required for preparation. - Use of a flavored hard candy, such as grape Anise Salvo, can counteract some of the flavor of the prep and may  prevent some nausea.   Thank you for trusting me with your gastrointestinal care!    Thornton Park, MD, MPH

## 2019-11-14 ENCOUNTER — Ambulatory Visit: Payer: 59 | Attending: Internal Medicine

## 2019-11-14 DIAGNOSIS — Z23 Encounter for immunization: Secondary | ICD-10-CM

## 2019-11-14 NOTE — Progress Notes (Signed)
   Covid-19 Vaccination Clinic  Name:  Jordan Huber    MRN: [id number] DOB: [birth year]  11/14/2019  Mr. Jordan Huber was observed post Covid-19 immunization for 15 minutes without incident. He was provided with Vaccine Information Sheet and instruction to access the V-Safe system.   Mr. Jordan Huber was instructed to call 911 with any severe reactions post vaccine: Marland Kitchen Difficulty breathing  . Swelling of face and throat  . A fast heartbeat  . A bad rash all over body  . Dizziness and weakness   Immunizations Administered    Name Date Dose VIS Date Route   Pfizer COVID-19 Vaccine 11/14/2019  9:39 AM 0.3 mL 08/17/2018 Intramuscular   Manufacturer: [place]   Lot: [id number]   [place]: [id number]

## 2019-11-15 ENCOUNTER — Ambulatory Visit (HOSPITAL_COMMUNITY): Payer: 59

## 2019-12-02 ENCOUNTER — Other Ambulatory Visit: Payer: Self-pay

## 2019-12-02 ENCOUNTER — Ambulatory Visit (HOSPITAL_COMMUNITY)
Admission: RE | Admit: 2019-12-02 | Discharge: 2019-12-02 | Disposition: A | Discharge status: Home / Self Care | Payer: 59 | Source: Ambulatory Visit | Attending: Gastroenterology | Admitting: Gastroenterology

## 2019-12-02 DIAGNOSIS — K6289 Other specified diseases of anus and rectum: Secondary | ICD-10-CM | POA: Diagnosis not present

## 2019-12-02 MED ORDER — GADOBUTROL 1 MMOL/ML IV SOLN
7.0000 mL | Freq: Once | INTRAVENOUS | Status: AC | PRN
  Administered 2019-12-02: 7 mL via INTRAVENOUS

## 2019-12-05 ENCOUNTER — Telehealth: Payer: Self-pay | Admitting: Gastroenterology

## 2019-12-05 NOTE — Telephone Encounter (Signed)
Patient calling about imaging results, please call patient.

## 2019-12-05 NOTE — Telephone Encounter (Signed)
Pt calling for MR results, please advise.

## 2019-12-05 NOTE — Telephone Encounter (Signed)
Spoke with patient regarding recommendations, see MRI results note for more information.

## 2019-12-05 NOTE — Telephone Encounter (Signed)
Same response from result note: Please call the patient. His MRI shows a perianal fistula. We should proceed with colonoscopy as previously scheduled. I would also recommend that he follow-up with his surgeon. Please fax a copy of this report to Dr. Johney Maine' office. Thank you.

## 2019-12-08 ENCOUNTER — Other Ambulatory Visit: Payer: Self-pay

## 2019-12-08 ENCOUNTER — Telehealth: Payer: Self-pay | Admitting: Gastroenterology

## 2019-12-08 MED ORDER — NA SULFATE-K SULFATE-MG SULF 17.5-3.13-1.6 GM/177ML PO SOLN: 1.0000 | Freq: Once | ORAL | 0 refills | Status: AC

## 2019-12-08 NOTE — Telephone Encounter (Signed)
Suprep has been sent ?

## 2019-12-08 NOTE — Telephone Encounter (Signed)
Please send Suprep to patient pharmacy. His procedure is scheduled for tomorrow with Dr Tarri Glenn.  Pharmacy: [facility]

## 2019-12-09 ENCOUNTER — Ambulatory Visit (AMBULATORY_SURGERY_CENTER): Payer: 59 | Admitting: Gastroenterology

## 2019-12-09 ENCOUNTER — Encounter: Payer: Self-pay | Admitting: Gastroenterology

## 2019-12-09 ENCOUNTER — Other Ambulatory Visit: Payer: Self-pay

## 2019-12-09 VITALS — BP 90/65 | HR 62 | Temp 97.5°F | Resp 15 | Ht 68.0 in | Wt 157.0 lb

## 2019-12-09 DIAGNOSIS — D123 Benign neoplasm of transverse colon: Secondary | ICD-10-CM

## 2019-12-09 DIAGNOSIS — K635 Polyp of colon: Secondary | ICD-10-CM

## 2019-12-09 DIAGNOSIS — D125 Benign neoplasm of sigmoid colon: Secondary | ICD-10-CM | POA: Diagnosis not present

## 2019-12-09 DIAGNOSIS — K6289 Other specified diseases of anus and rectum: Secondary | ICD-10-CM | POA: Diagnosis not present

## 2019-12-09 DIAGNOSIS — D120 Benign neoplasm of cecum: Secondary | ICD-10-CM

## 2019-12-09 MED ORDER — SODIUM CHLORIDE 0.9 % IV SOLN: 500.0000 mL | Freq: Once | INTRAVENOUS | Status: DC

## 2019-12-09 NOTE — Progress Notes (Signed)
Called to room to assist during endoscopic procedure.  Patient ID and intended procedure confirmed with present staff. Received instructions for my participation in the procedure from the performing physician.  

## 2019-12-09 NOTE — Progress Notes (Signed)
A/ox3, pleased with MAC, report to RN 

## 2019-12-09 NOTE — Op Note (Signed)
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 The patient's tolerance of                            previous anesthesia was also reviewed. The risks                            and benefits of the procedure and the sedation                            options and risks were discussed with the patient.                            All questions were answered, and informed consent                            was obtained. Prior Anticoagulants: The patient has                            taken no previous anticoagulant or antiplatelet                            agents. ASA Grade Assessment: II - A patient with                            mild systemic  disease. After reviewing the risks                            and benefits, the patient was deemed in                            satisfactory condition to undergo the procedure.                           After obtaining informed consent, the colonoscope                            was passed under direct vision. Throughout the                            procedure, the patient's blood pressure, pulse, and                            oxygen saturations were monitored  continuously. The                            Colonoscope was introduced through the anus and                            advanced to the 10 cm into the ileum. A second                            forward view of the right colon was performed. The                            colonoscopy was performed without difficulty. The                            patient tolerated the procedure well. The quality                            of the bowel preparation was good. The terminal                            ileum, ileocecal valve, appendiceal orifice, and                            rectum were photographed. Scope In: 1:36:36 PM Scope Out: 2:01:06 PM Scope Withdrawal Time: 0 hours 20 minutes 43 seconds  Total Procedure Duration: 0 hours 24 minutes 30 seconds  Findings:                 Three areas of dicolored mucosa was seen in the                            rectum, consistent with scarring. Biopsies were                            taken with a cold forceps for histology. Estimated  blood loss was minimal.                           A 3 mm polyp was found in the sigmoid colon. The                            polyp was sessile. The polyp was removed with a                            cold snare. Resection and retrieval were complete.                            Estimated blood loss was minimal.                           A less than 1 mm polyp was found in the splenic                            flexure. The polyp was flat. The polyp was removed                            with a cold biopsy forceps. Resection and retrieval                            were complete. Estimated blood loss was minimal.                           A 2 mm polyp was found in the ileocecal valve. The                            polyp was flat. The polyp was removed with a cold                            snare. Resection and retrieval were complete.                            Estimated blood loss was minimal.                            Two tubular polyps were found in the transverse                            colon. The polyps were 2 to 3 mm in size. The                            surrounding mucosa appeared normal. These polyps                            were removed with a cold snare. Resection and                            retrieval were complete. Estimated blood loss was  minimal.                           The remainder of the examined colonic mucosa                            appeared normal. Biopsies were taken from the right                            colon, transverse colon, and left colon with a cold                            forceps for histology. Estimated blood loss was                            minimal.                           The terminal ileum appeared normal. Approximately                            10 cm of terminal ileum was evaluated.                           The exam was otherwise without abnormality on                            direct and retroflexion views except for internal                            hemorrhoids. Complications:            No immediate complications. Estimated blood loss:                            Minimal. Estimated Blood Loss:     Estimated blood loss was minimal. Impression:               - Abnormal mucosa in the rectum. Consistent with                            scarring. Biopsied.                           - One 3 mm polyp in the sigmoid colon, removed with                            a cold snare. Resected and retrieved.                           - One less than 1 mm polyp at the splenic flexure,                            removed with a cold biopsy forceps. Resected and  retrieved.                           - One 2 mm polyp at the ileocecal valve, removed                            with a cold snare. Resected and retrieved.                           - Two 2 to 3 mm tubular polyps in the  transverse                            colon, removed with a cold snare. Resected and                            retrieved.                           - The entire examined colon is normal. Biopsied.                           - The examined portion of the ileum was normal.                           - The examination was otherwise normal on direct                            and retroflexion views. Recommendation:           - Patient has a contact number available for                            emergencies. The signs and symptoms of potential                            delayed complications were discussed with the                            patient. Return to normal activities tomorrow.                            Written discharge instructions were provided to the                            patient.                           - Resume previous diet.                           - Continue present medications.                           - Await pathology results.                           -  Repeat colonoscopy date to be determined after                            pending pathology results are reviewed for                            surveillance.                           - Emerging evidence supports eating a diet of                            fruits, vegetables, grains, calcium, and yogurt                            while reducing red meat and alcohol may reduce the                            risk of colon cancer. Thornton Park MD, MD 12/09/2019 2:11:01 PM This report has been signed electronically.

## 2019-12-09 NOTE — Patient Instructions (Signed)
Handouts Provided:  Polyps  YOU HAD AN ENDOSCOPIC PROCEDURE TODAY AT THE [facility]:   Refer to the procedure report that was given to you for any specific questions about what was found during the examination.  If the procedure report does not answer your questions, please call your gastroenterologist to clarify.  If you requested that your care partner not be given the details of your procedure findings, then the procedure report has been included in a sealed envelope for you to review at your convenience later.  YOU SHOULD EXPECT: Some feelings of bloating in the abdomen. Passage of more gas than usual.  Walking can help get rid of the air that was put into your GI tract during the procedure and reduce the bloating. If you had a lower endoscopy (such as a colonoscopy or flexible sigmoidoscopy) you may notice spotting of blood in your stool or on the toilet paper. If you underwent a bowel prep for your procedure, you may not have a normal bowel movement for a few days.  Please Note:  You might notice some irritation and congestion in your nose or some drainage.  This is from the oxygen used during your procedure.  There is no need for concern and it should clear up in a day or so.  SYMPTOMS TO REPORT IMMEDIATELY:   Following lower endoscopy (colonoscopy or flexible sigmoidoscopy):  Excessive amounts of blood in the stool  Significant tenderness or worsening of abdominal pains  Swelling of the abdomen that is new, acute  Fever of 100F or higher  For urgent or emergent issues, a gastroenterologist can be reached at any hour by calling [phone]. Do not use MyChart messaging for urgent concerns.    DIET:  We do recommend a small meal at first, but then you may proceed to your regular diet.  Drink plenty of fluids but you should avoid alcoholic beverages for 24 hours.  ACTIVITY:  You should plan to take it easy for the rest of today and you should NOT DRIVE or use heavy  machinery until tomorrow (because of the sedation medicines used during the test).    FOLLOW UP: Our staff will call the number listed on your records 48-72 hours following your procedure to check on you and address any questions or concerns that you may have regarding the information given to you following your procedure. If we do not reach you, we will leave a message.  We will attempt to reach you two times.  During this call, we will ask if you have developed any symptoms of COVID 19. If you develop any symptoms (ie: fever, flu-like symptoms, shortness of breath, cough etc.) before then, please call [phone].  If you test positive for Covid 19 in the 2 weeks post procedure, please call and report this information to us.    If any biopsies were taken you will be contacted by phone or by letter within the next 1-3 weeks.  Please call us at [phone] if you have not heard about the biopsies in 3 weeks.    SIGNATURES/CONFIDENTIALITY: You and/or your care partner have signed paperwork which will be entered into your electronic medical record.  These signatures attest to the fact that that the information above on your After Visit Summary has been reviewed and is understood.  Full responsibility of the confidentiality of this discharge information lies with you and/or your care-partner.  

## 2019-12-13 ENCOUNTER — Telehealth: Payer: Self-pay

## 2019-12-13 NOTE — Telephone Encounter (Signed)
Attempted to reach pt. With follow-up call following endoscopic procedure 12/09/2019.  LM on pt. Voice mail.  Will try to reach pt. Again later today.

## 2019-12-13 NOTE — Telephone Encounter (Signed)
2nd follow up call made.  NALM 

## 2019-12-14 ENCOUNTER — Encounter: Payer: Self-pay | Admitting: Gastroenterology

## 2019-12-20 ENCOUNTER — Telehealth: Payer: Self-pay | Admitting: Gastroenterology

## 2019-12-20 NOTE — Telephone Encounter (Signed)
Spoke with pt and reviewed result letter with pt over the phone. He is aware that he needs to follow-up with the surgeon.

## 2019-12-20 NOTE — Telephone Encounter (Signed)
Pt would like a call with path results. He does not fully understand his results and would like to speak with somebody about it.

## 2020-01-25 ENCOUNTER — Encounter: Payer: Self-pay | Admitting: Surgery

## 2020-02-15 ENCOUNTER — Telehealth: Payer: Self-pay

## 2020-02-15 NOTE — Telephone Encounter (Signed)
Appt scheduled

## 2020-02-15 NOTE — Telephone Encounter (Signed)
Pt is wanting Dr. Marigene Ehlers opinion on a surgery. I was unable to understand what the surgery was for. Pt states Dr. Jerline Pain will know what he is talking about.

## 2020-02-15 NOTE — Telephone Encounter (Signed)
Please schedule a  appt to discuss

## 2020-02-20 ENCOUNTER — Encounter: Payer: Self-pay | Admitting: Family Medicine

## 2020-02-20 ENCOUNTER — Telehealth (INDEPENDENT_AMBULATORY_CARE_PROVIDER_SITE_OTHER): Payer: 59 | Admitting: Family Medicine

## 2020-02-20 VITALS — Ht 68.0 in | Wt 160.9 lb

## 2020-02-20 DIAGNOSIS — K603 Anal fistula: Secondary | ICD-10-CM | POA: Diagnosis not present

## 2020-02-20 NOTE — Progress Notes (Signed)
   Jordan Huber is a 39 y.o. male who presents today for a virtual office visit.  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  Will place referral to [facility] per patient request.  He will contact me tomorrow with the name of the surgeon that he would like to see.      Subjective:  HPI:  Patient had virtual visit about 3 months ago for rectal pain. Was referred to GI and underwent colonoscopy and MRI. MRI revealed persistent interphincteric fistula. Patient underwent surgery for fistula last year. He was referred back to his surgeon who suggested repeat surgery. Patient wishes to have second opinion before getting another surgery due to persistence of the fistula after last year.  No fevers or chills.  Pain currently manageable.       Objective/Observations  Physical Exam: Gen: NAD, resting comfortably Pulm: Normal work of breathing Neuro: Grossly normal, moves all extremities Psych: Normal affect and thought content  Virtual Visit via Video   I connected with Jordan Huber on 02/20/20 at  3:40 PM EDT by a video enabled telemedicine application and verified that I am speaking with the correct person using two identifiers. The limitations of evaluation and management by telemedicine and the availability of in person appointments were discussed. The patient expressed understanding and agreed to proceed.   Patient location: Home Provider location: [place] participating in the virtual visit: Myself and Patient     Algis Greenhouse. Jerline Pain, MD 02/20/2020 3:28 PM

## 2020-02-20 NOTE — Assessment & Plan Note (Addendum)
No red flags.  Will place referral to [facility] per patient request.  He will contact me tomorrow with the name of the surgeon that he would like to see.

## 2020-03-07 ENCOUNTER — Encounter: Payer: Self-pay | Admitting: Family Medicine

## 2020-03-08 NOTE — Telephone Encounter (Signed)
Please advise 

## 2020-03-09 ENCOUNTER — Other Ambulatory Visit: Payer: Self-pay | Admitting: *Deleted

## 2020-03-13 ENCOUNTER — Other Ambulatory Visit: Payer: Self-pay

## 2020-03-13 DIAGNOSIS — K603 Anal fistula: Secondary | ICD-10-CM

## 2020-03-13 NOTE — Telephone Encounter (Signed)
Pt called following up on GI referral. Please advise.

## 2020-06-25 ENCOUNTER — Telehealth: Payer: Self-pay

## 2020-06-25 NOTE — Telephone Encounter (Signed)
Please schedule pt for virtual.  

## 2020-06-25 NOTE — Telephone Encounter (Signed)
Nurse Assessment Nurse: Solocinski, RN, Beth Date/Time (Eastern Time): 06/24/2020 3:05:58 PM Confirm and document reason for call. If symptomatic, describe symptoms. ---Caller states he has body aches and fever 100.9 since 06/22/2020. Caller states now cough since yesterday Does the patient have any new or worsening symptoms? ---Yes Will a triage be completed? ---Yes Related visit to physician within the last 2 weeks? ---No Does the PT have any chronic conditions? (i.e. diabetes, asthma, this includes High risk factors for pregnancy, etc.) ---No Is this a behavioral health or substance abuse call? ---No Guidelines Guideline Title Affirmed Question Affirmed Notes Nurse Date/Time (Eastern Time) COVID-19 - Diagnosed or Suspected Chest pain or pressure Solocinski, RN, Beth 06/24/2020 3:08:02 PM Disp. Time Lamount Cohen Time) Disposition Final User 06/24/2020 3:09:55 PM Go to ED Now (or PCP triage) Yes Solocinski, RN, Beth Caller Disagree/Comply Comply Caller Understands Yes PreDisposition Did not know what to do Care Advice Given Per Guideline PLEASE NOTE: All timestamps contained within this report are represented as [place] Standard Time. CONFIDENTIALTY NOTICE: This fax transmission is intended only for the addressee. It contains information that is legally privileged, confidential or otherwise protected from use or disclosure. If you are not the intended recipient, you are strictly prohibited from reviewing, disclosing, copying using or disseminating any of this information or taking any action in reliance on or regarding this information. If you have received this fax in error, please notify us immediately by telephone so that we can arrange for its return to [place]. Phone: [phone], Toll-Free: [phone], Fax: [phone] Page: 2 of 2 Call Id: [id number] Care Advice Given Per Guideline GO TO ED NOW (OR PCP TRIAGE): CARE ADVICE given per COVID-19 - DIAGNOSED OR SUSPECTED (Adult)  guideline. Referrals GO TO FACILITY UNDECIDE

## 2020-06-25 NOTE — Telephone Encounter (Signed)
Called patient and he denied an appointment . States his fever broke, his coughing has lightened up and is feeling much better.

## 2020-06-29 ENCOUNTER — Other Ambulatory Visit: Payer: 59

## 2020-07-04 ENCOUNTER — Other Ambulatory Visit: Payer: 59

## 2020-07-04 DIAGNOSIS — Z20822 Contact with and (suspected) exposure to covid-19: Secondary | ICD-10-CM

## 2020-07-06 ENCOUNTER — Encounter: Payer: Self-pay | Admitting: Family Medicine

## 2020-07-06 LAB — SARS-COV-2, NAA 2 DAY TAT

## 2020-07-06 LAB — NOVEL CORONAVIRUS, NAA: SARS-CoV-2, NAA: NOT DETECTED

## 2020-07-16 ENCOUNTER — Other Ambulatory Visit: Payer: 59

## 2020-07-16 DIAGNOSIS — Z20822 Contact with and (suspected) exposure to covid-19: Secondary | ICD-10-CM

## 2020-07-17 LAB — NOVEL CORONAVIRUS, NAA: SARS-CoV-2, NAA: NOT DETECTED

## 2020-07-17 LAB — SARS-COV-2, NAA 2 DAY TAT

## 2021-06-15 IMAGING — MR MR PELVIS WO/W CM
6 of 10 series · 29 of 48 positions shown · IV contrast (gadavist)
Comparison: None.

CLINICAL DATA: Anorectal pain, previous fistula repair

EXAM:
MRI PELVIS WITHOUT AND WITH CONTRAST
TECHNIQUE: Multiplanar multisequence MR imaging of the pelvis was performed
both before and after administration of intravenous contrast.
CONTRAST:  7mL GADAVIST GADOBUTROL 1 MMOL/ML IV SOLN

[Series 2: T2 · sagittal · 2.5mm · 1.02mm/px · 6 of 50 slices shown (1 of 3)]
[im 1/50]
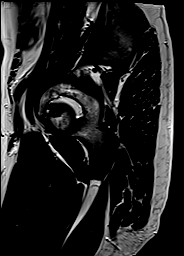
[im 10/50]
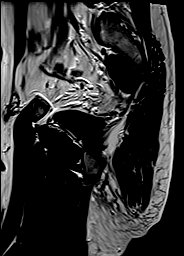
[im 20/50]
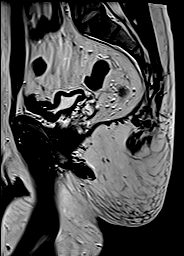
[im 30/50]
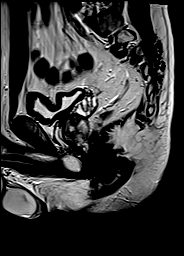
[im 40/50]
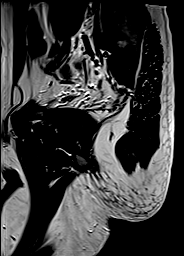
[im 50/50]
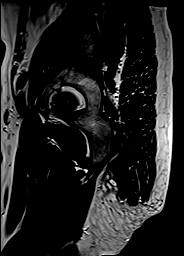

[Series 3: T2 fat-sat · sagittal · 2.5mm · 1.02mm/px · 6 of 50 slices shown (1 of 2)]
[im 1/50]
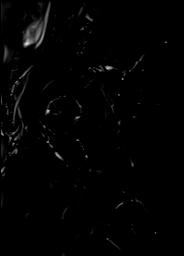
[im 10/50]
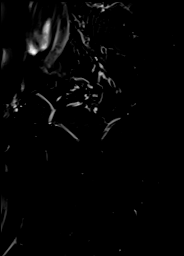
[im 20/50]
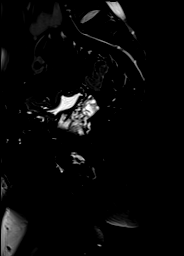
[im 30/50]
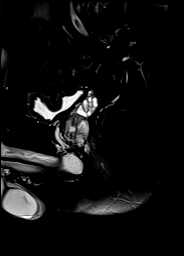
[im 40/50]
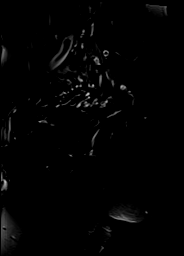
[im 50/50]
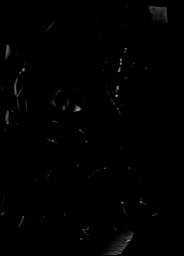

[Series 4: T2 · coronal · 5.0mm · 0.51mm/px · 4 of 34 slices shown (2 of 3)]
[im 1/34]
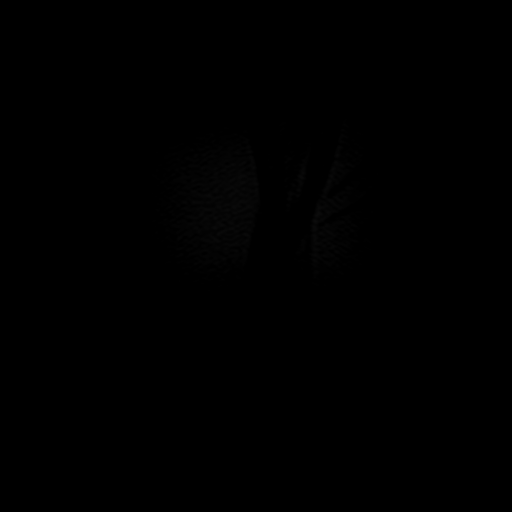
[im 12/34]
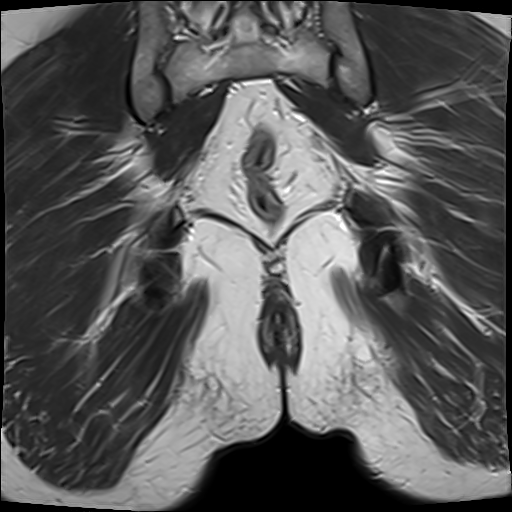
[im 23/34]
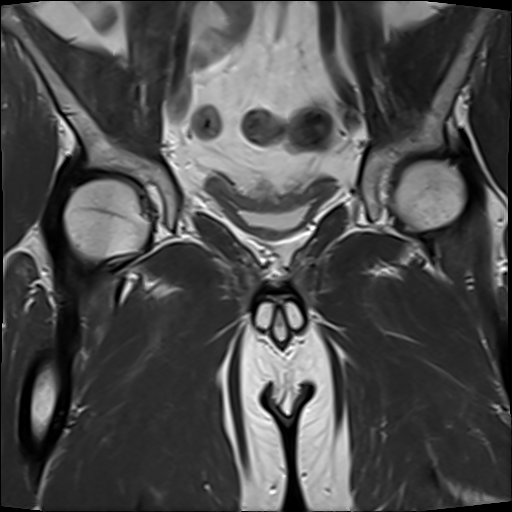
[im 34/34]
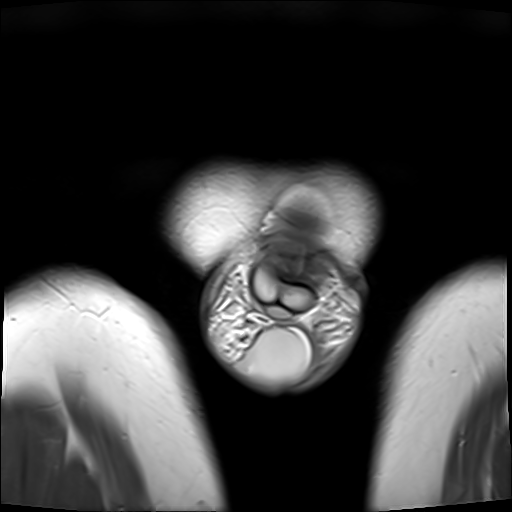

[Series 5: T1 · axial · 4.0mm · 0.43mm/px · z∈[-134,+22]mm · 5 of 36 slices shown]
[im 1/36]
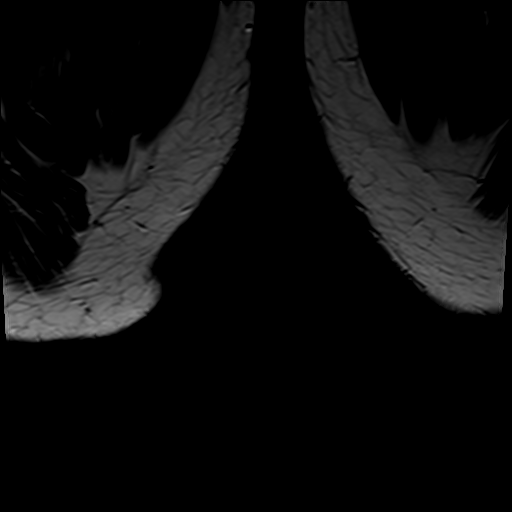
[im 9/36]
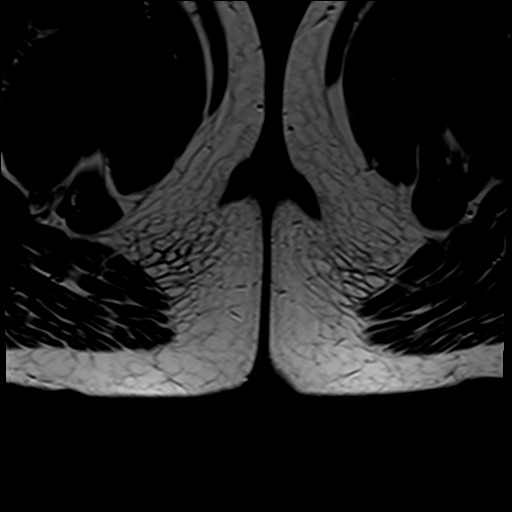
[im 18/36]
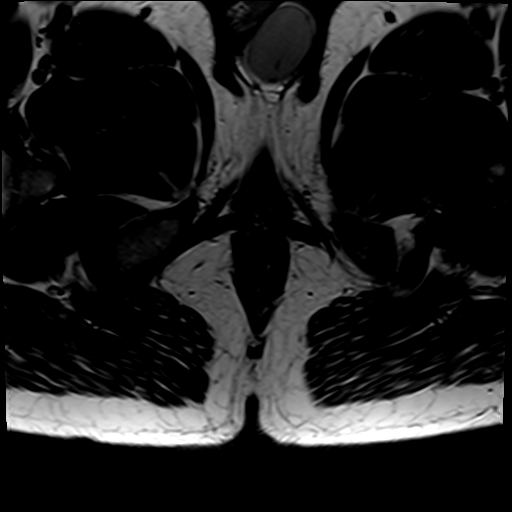
[im 27/36]
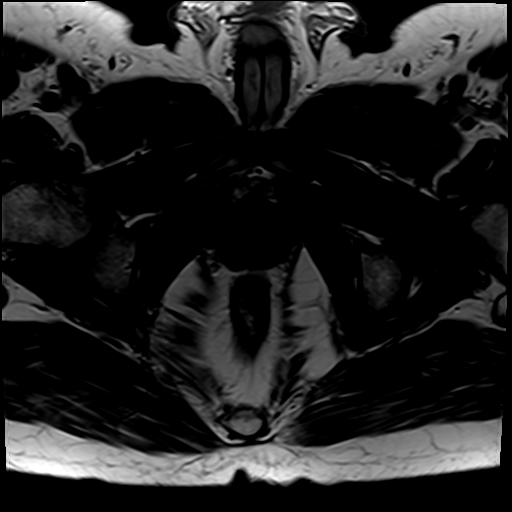
[im 36/36]
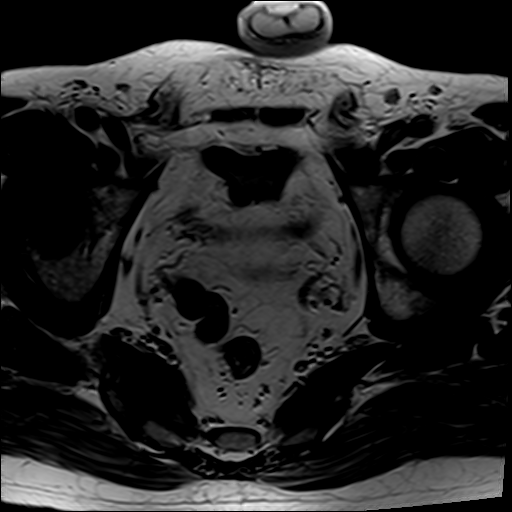

[Series 6: T2 · axial · 4.0mm · 0.43mm/px · z∈[-134,+22]mm · 5 of 36 slices shown (3 of 3)]
[im 1/36]
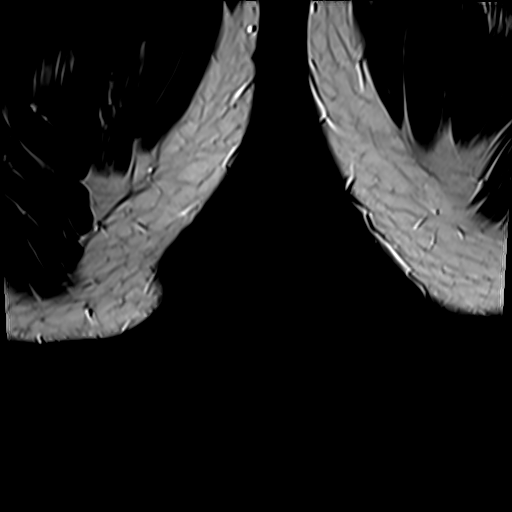
[im 9/36]
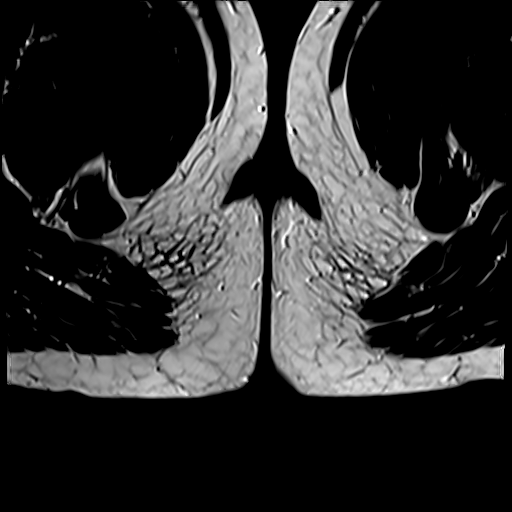
[im 18/36]
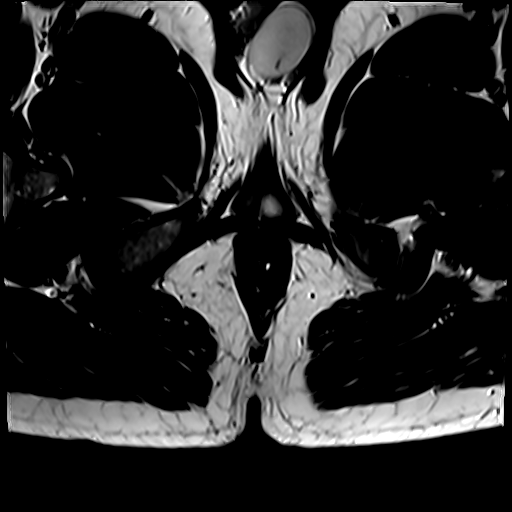
[im 27/36]
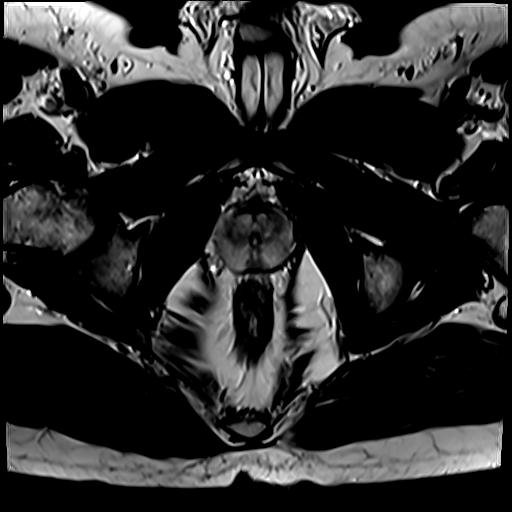
[im 36/36]
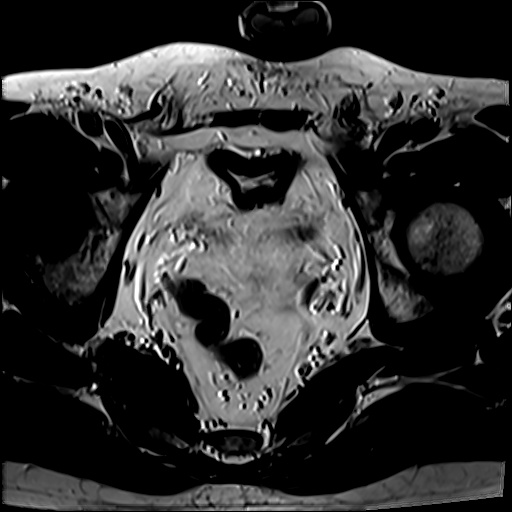

[Series 7: T2 fat-sat · axial · 4.0mm · 0.43mm/px · z∈[-134,-58]mm · 3 of 36 slices shown (2 of 2)]
[im 1/36]
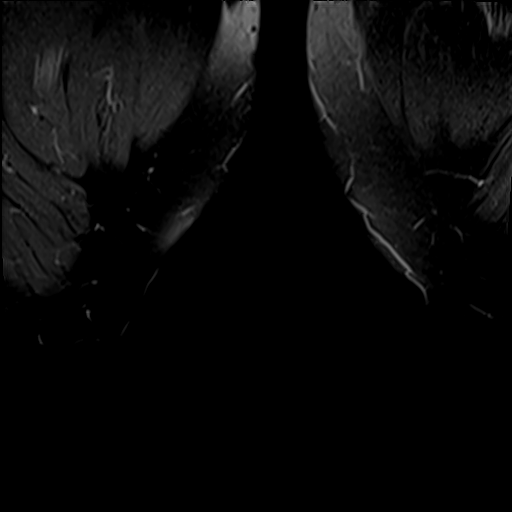
[im 9/36]
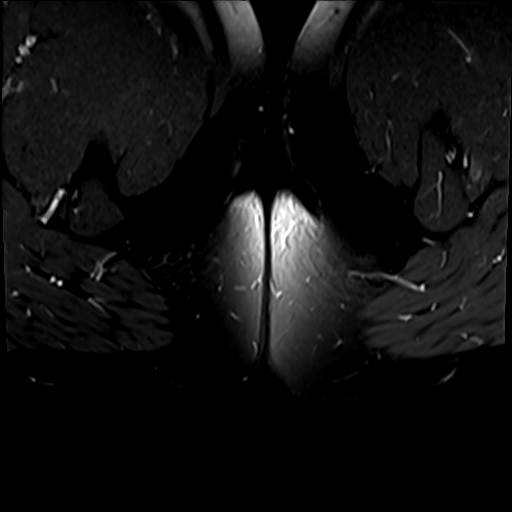
[im 18/36]
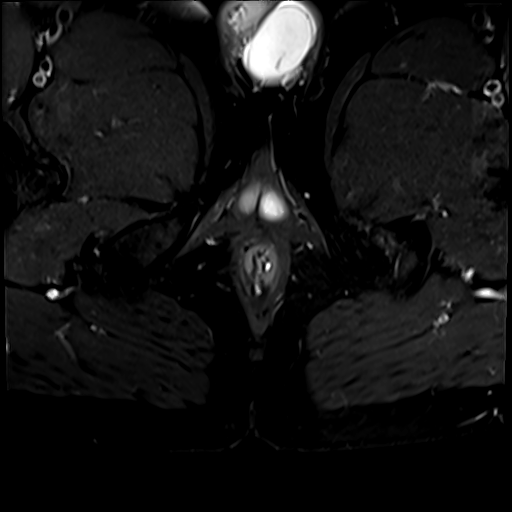

[29 of 48 positions shown; findings below may reference images not displayed]

FINDINGS: Urinary Tract:  No abnormality visualized.

Bowel: There is a fluid signal, contrast enhancing intersphincteric
perianal fistula located at the midline posteriorly, 6 o'clock face
in lithotomy position (series 7, image 20, series 3, image 28,
series 10, image 20).

Vascular/Lymphatic: No pathologically enlarged lymph nodes. No
significant vascular abnormality seen.

Reproductive:  No mass or other significant abnormality

Other:  None.

Musculoskeletal: No suspicious bone lesions identified.
IMPRESSION: Intersphincteric perianal fistula located at the midline
posteriorly, 6 o'clock face in lithotomy position. No evidence of
abscess.

## 2021-06-21 ENCOUNTER — Other Ambulatory Visit: Payer: Self-pay

## 2021-06-21 ENCOUNTER — Ambulatory Visit (INDEPENDENT_AMBULATORY_CARE_PROVIDER_SITE_OTHER): Payer: 59 | Admitting: Family Medicine

## 2021-06-21 ENCOUNTER — Encounter: Payer: Self-pay | Admitting: Family Medicine

## 2021-06-21 VITALS — BP 105/70 | HR 74 | Temp 97.7°F | Ht 68.0 in | Wt 174.0 lb

## 2021-06-21 DIAGNOSIS — E663 Overweight: Secondary | ICD-10-CM

## 2021-06-21 DIAGNOSIS — Z6826 Body mass index (BMI) 26.0-26.9, adult: Secondary | ICD-10-CM

## 2021-06-21 DIAGNOSIS — Z0001 Encounter for general adult medical examination with abnormal findings: Secondary | ICD-10-CM

## 2021-06-21 DIAGNOSIS — E785 Hyperlipidemia, unspecified: Secondary | ICD-10-CM | POA: Diagnosis not present

## 2021-06-21 DIAGNOSIS — K603 Anal fistula: Secondary | ICD-10-CM

## 2021-06-21 NOTE — Assessment & Plan Note (Signed)
Follows with surgery.  No current issues.

## 2021-06-21 NOTE — Progress Notes (Signed)
Chief Complaint:  Jordan Huber is a 40 y.o. male who presents today for his annual comprehensive physical exam.    Assessment/Plan:  Chronic Problems Addressed Today: Dyslipidemia Continue lifestyle modifications.  Recheck labs next year.  Intersphincteric fistula Follows with surgery.  No current issues.  Body mass index is 26.46 kg/m. / Overweight  BMI Metric Follow Up - 06/21/21 0958       BMI Metric Follow Up-Please document annually   BMI Metric Follow Up Education provided             Preventative Healthcare: Declined flu and tdap. Check labs next year.   Patient Counseling(The following topics were reviewed and/or handout was given):  -Nutrition: Stressed importance of moderation in sodium/caffeine intake, saturated fat and cholesterol, caloric balance, sufficient intake of fresh fruits, vegetables, and fiber.  -Stressed the importance of regular exercise.   -Substance Abuse: Discussed cessation/primary prevention of tobacco, alcohol, or other drug use; driving or other dangerous activities under the influence; availability of treatment for abuse.   -Injury prevention: Discussed safety belts, safety helmets, smoke detector, smoking near bedding or upholstery.   -Sexuality: Discussed sexually transmitted diseases, partner selection, use of condoms, avoidance of unintended pregnancy and contraceptive alternatives.   -Dental health: Discussed importance of regular tooth brushing, flossing, and dental visits.  -Health maintenance and immunizations reviewed. Please refer to Health maintenance section.  Return to care in 1 year for next preventative visit.     Subjective:  HPI:  He has no acute complaints today.   Lifestyle Diet: Cutting down on carbs.  Exercise: Working on treadmill 3-4 days per week. Plays pickleball.   Depression screen PHQ 2/9 06/21/2021  Decreased Interest 0  Down, Depressed, Hopeless 0  PHQ - 2 Score 0    Health Maintenance Due   Topic Date Due   HIV Screening  Never done   Hepatitis C Screening  Never done   TETANUS/TDAP  Never done   COVID-19 Vaccine (3 - Booster for Pfizer series) 01/09/2020     ROS: Per HPI, otherwise a complete review of systems was negative.   PMH:  The following were reviewed and entered/updated in epic: History reviewed. No pertinent past medical history. 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   No current facility-administered medications for this visit.    Allergies-reviewed and updated No Known Allergies  Social History   Socioeconomic History   Marital status: Married    Spouse name: Not on file   Number of children: 2   Years of education: Not on file   Highest education level: Not on file  Occupational History    Comment: IT software  Tobacco Use   Smoking status: Never   Smokeless tobacco: Never  Vaping Use   Vaping Use: Never used  Substance and Sexual Activity   Alcohol use:  Not Currently   Drug use: No   Sexual activity: Not on file  Other Topics Concern   Not on file  Social History Narrative   Originally from [place]   Social Determinants of Health   Financial Resource Strain: Not on file  Food Insecurity: Not on file  Transportation Needs: Not on file  Physical Activity: Not on file  Stress: Not on file  Social Connections: Not on file        Objective:  Physical Exam: BP 105/70 (BP Location: Left Arm)    Pulse 74    Temp  97.7 F (36.5 C) (Temporal)    Ht 5\' 8"  (1.727 m)    Wt 174 lb (78.9 kg)    SpO2 97%    BMI 26.46 kg/m   Body mass index is 26.46 kg/m. Wt Readings from Last 3 Encounters:  06/21/21 174 lb (78.9 kg)  02/20/20 160 lb 15 oz (73 kg)  12/09/19 157 lb (71.2 kg)   Gen: NAD, resting comfortably HEENT: TMs normal bilaterally. OP clear. No thyromegaly noted.  CV: RRR with no murmurs appreciated Pulm: NWOB, CTAB with no crackles, wheezes, or rhonchi GI: Normal bowel sounds present. Soft, Nontender, Nondistended. MSK: no edema, cyanosis, or clubbing noted Skin: warm, dry Neuro: CN2-12 grossly intact. Strength 5/5 in upper and lower extremities. Reflexes symmetric and intact bilaterally.  Psych: Normal affect and thought content     Dayane Hillenburg M. Jerline Pain, MD 06/21/2021 9:59 AM

## 2021-06-21 NOTE — Assessment & Plan Note (Signed)
Continue lifestyle modifications.  Recheck labs next year.

## 2021-06-21 NOTE — Patient Instructions (Signed)
It was very nice to see you today!  Keep up the good work!  No changes today.  Please continue to work on diet and exercise.  We will see you back next year for your next physical.  We should check blood work next year.  Please come back to see me sooner if needed.  Take care, Dr Jerline Pain  PLEASE NOTE:  If you had any lab tests please let us know if you have not heard back within a few days. You may see your results on mychart before we have a chance to review them but we will give you a call once they are reviewed by [place]. If we ordered any referrals today, please let us know if you have not heard from their office within the next week.   Please try these tips to maintain a healthy lifestyle:  Eat at least 3 REAL meals and 1-2 snacks per day.  Aim for no more than 5 hours between eating.  If you eat breakfast, please do so within one hour of getting up.   Each meal should contain half fruits/vegetables, one quarter protein, and one quarter carbs (no bigger than a computer mouse)  Cut down on sweet beverages. This includes juice, soda, and sweet tea.   Drink at least 1 glass of water with each meal and aim for at least 8 glasses per day  Exercise at least 150 minutes every week.    Preventive Care 31-47 Years Old, Male Preventive care refers to lifestyle choices and visits with your health care provider that can promote health and wellness. Preventive care visits are also called wellness exams. What can I expect for my preventive care visit? Counseling During your preventive care visit, your health care provider may ask about your: Medical history, including: Past medical problems. Family medical history. Current health, including: Emotional well-being. Home life and relationship well-being. Sexual activity. Lifestyle, including: Alcohol, nicotine or tobacco, and drug use. Access to firearms. Diet, exercise, and sleep habits. Safety issues such as seatbelt and bike helmet  use. Sunscreen use. Work and work Statistician. Physical exam Your health care provider may check your: Height and weight. These may be used to calculate your BMI (body mass index). BMI is a measurement that tells if you are at a healthy weight. Waist circumference. This measures the distance around your waistline. This measurement also tells if you are at a healthy weight and may help predict your risk of certain diseases, such as type 2 diabetes and high blood pressure. Heart rate and blood pressure. Body temperature. Skin for abnormal spots. What immunizations do I need? Vaccines are usually given at various ages, according to a schedule. Your health care provider will recommend vaccines for you based on your age, medical history, and lifestyle or other factors, such as travel or where you work. What tests do I need? Screening Your health care provider may recommend screening tests for certain conditions. This may include: Lipid and cholesterol levels. Diabetes screening. This is done by checking your blood sugar (glucose) after you have not eaten for a while (fasting). Hepatitis B test. Hepatitis C test. HIV (human immunodeficiency virus) test. STI (sexually transmitted infection) testing, if you are at risk. Talk with your health care provider about your test results, treatment options, and if necessary, the need for more tests. Follow these instructions at home: Eating and drinking  Eat a healthy diet that includes fresh fruits and vegetables, whole grains, lean protein, and low-fat dairy products. Drink  enough fluid to keep your urine pale yellow. Take vitamin and mineral supplements as recommended by your health care provider. Do not drink alcohol if your health care provider tells you not to drink. If you drink alcohol: Limit how much you have to 0-2 drinks a day. Know how much alcohol is in your drink. In the [place] one drink equals one 12 oz bottle of beer (355 mL), one 5 oz  glass of wine (148 mL), or one 1 oz glass of hard liquor (44 mL). Lifestyle Brush your teeth every morning and night with fluoride toothpaste. Floss one time each day. Exercise for at least 30 minutes 5 or more days each week. Do not use any products that contain nicotine or tobacco. These products include cigarettes, chewing tobacco, and vaping devices, such as e-cigarettes. If you need help quitting, ask your health care provider. Do not use drugs. If you are sexually active, practice safe sex. Use a condom or other form of protection to prevent STIs. Find healthy ways to manage stress, such as: Meditation, yoga, or listening to music. Journaling. Talking to a trusted person. Spending time with friends and family. Minimize exposure to UV radiation to reduce your risk of skin cancer. Safety Always wear your seat belt while driving or riding in a vehicle. Do not drive: If you have been drinking alcohol. Do not ride with someone who has been drinking. If you have been using any mind-altering substances or drugs. While texting. When you are tired or distracted. Wear a helmet and other protective equipment during sports activities. If you have firearms in your house, make sure you follow all gun safety procedures. Seek help if you have been physically or sexually abused. What's next? Go to your health care provider once a year for an annual wellness visit. Ask your health care provider how often you should have your eyes and teeth checked. Stay up to date on all vaccines. This information is not intended to replace advice given to you by your health care provider. Make sure you discuss any questions you have with your health care provider. Document Revised: 12/05/2020 Document Reviewed: 12/05/2020 Elsevier Patient Education  [place].

## 2021-09-18 ENCOUNTER — Encounter: Payer: Self-pay | Admitting: Family Medicine

## 2021-09-18 ENCOUNTER — Ambulatory Visit (INDEPENDENT_AMBULATORY_CARE_PROVIDER_SITE_OTHER): Payer: 59 | Admitting: Family Medicine

## 2021-09-18 VITALS — BP 108/70 | HR 95 | Temp 98.2°F | Ht 68.5 in | Wt 173.4 lb

## 2021-09-18 DIAGNOSIS — H6501 Acute serous otitis media, right ear: Secondary | ICD-10-CM | POA: Diagnosis not present

## 2021-09-18 MED ORDER — AMOXICILLIN-POT CLAVULANATE 875-125 MG PO TABS: 1.0000 | ORAL_TABLET | Freq: Two times a day (BID) | ORAL | 0 refills | Status: DC

## 2021-09-18 NOTE — Patient Instructions (Signed)
Meds have been sent the the pharmacy ?You can take tylenol '1000mg'$  every 6 hrs as need and/or ibuprofen '600mg'$  every 6 hrs as needed or '800mg'$  every 8 hrs ?If worsening symptoms, let us know or go to the Emergency room  ?

## 2021-09-18 NOTE — Progress Notes (Signed)
? ?  Subjective:  ? ? ? Patient ID: Jordan Huber, male    DOB: 01-Jun-1981, 41 y.o.   MRN: [id number] ? ?Chief Complaint  ?Patient presents with  ? Ear Pain  ?  Right ear pain that started on yesterday ?Took Tylenol last night  ? ? ?HPI ?R ear pain since yesterday. No d/c. Mod pain.  No f/c,  some intermitt congestion for 3-4 days. Min cough. Daughter was sick and ear pain ? ?Health Maintenance Due  ?Topic Date Due  ? HIV Screening  Never done  ? Hepatitis C Screening  Never done  ? TETANUS/TDAP  Never done  ? ? ?History reviewed. No pertinent past medical history. ? ?Past Surgical History:  ?Procedure Laterality Date  ? RECTAL SURGERY    ? abscess  ? ? ?No outpatient medications prior to visit.  ? ?No facility-administered medications prior to visit.  ? ? ?No Known Allergies ?ROS neg/noncontributory except as noted HPI/below ? ? ?   ?Objective:  ?  ? ?BP 108/70   Pulse 95   Temp 98.2 ?F (36.8 ?C) (Temporal)   Ht 5' 8.5" (1.74 m)   Wt 173 lb 6 oz (78.6 kg)   SpO2 96%   BMI 25.98 kg/m?  ?Wt Readings from Last 3 Encounters:  ?09/18/21 173 lb 6 oz (78.6 kg)  ?06/21/21 174 lb (78.9 kg)  ?02/20/20 160 lb 15 oz (73 kg)  ? ? ?Physical Exam  ? ?Gen: WDWN NAD ?HEENT: NCAT, conjunctiva not injected, sclera nonicteric ?TM WNL L,  R TM pink, retracted, some fluid, OP moist, no exudates but red ?NECK:  supple, no thyromegaly, no nodes, no carotid bruits ?CARDIAC: RRR, S1S2+, no murmur.  ?LUNGS: CTAB. No wheezes ?EXT:  no edema ?MSK: no gross abnormalities.  ?NEURO: A&O x3.  CN II-XII intact.  ?PSYCH: normal mood. Good eye contact ? ?   ?Assessment & Plan:  ? ?Problem List Items Addressed This Visit   ?None ?Visit Diagnoses   ? ? Non-recurrent acute serous otitis media of right ear    -  Primary  ? Relevant Medications  ? amoxicillin-clavulanate (AUGMENTIN) 875-125 MG tablet  ? ?  ? ROM-d/t URI.  Augmentin, tylenol/motrin ? ?Meds ordered this encounter  ?Medications  ? amoxicillin-clavulanate (AUGMENTIN) 875-125 MG tablet  ?   Sig: Take 1 tablet by mouth 2 (two) times daily.  ?  Dispense:  20 tablet  ?  Refill:  0  ? ? ?Wellington Hampshire, MD ? ?

## 2022-03-17 ENCOUNTER — Encounter: Payer: Self-pay | Admitting: *Deleted

## 2022-06-05 ENCOUNTER — Encounter: Payer: Self-pay | Admitting: *Deleted

## 2022-08-29 ENCOUNTER — Encounter: Payer: 59 | Admitting: Family Medicine

## 2022-09-01 ENCOUNTER — Telehealth: Payer: Self-pay | Admitting: Family Medicine

## 2022-09-01 NOTE — Telephone Encounter (Signed)
Patient has OV tomorrow 09/01/2021 at 7:20am

## 2022-09-01 NOTE — Telephone Encounter (Signed)
Patient states: - Has been having fever and chills since 3/8  - Tylenol breaks fever but it returns after - Current fever 100.5   Patient has been transferred to triage.

## 2022-09-02 ENCOUNTER — Encounter: Payer: Self-pay | Admitting: Family Medicine

## 2022-09-02 ENCOUNTER — Ambulatory Visit (INDEPENDENT_AMBULATORY_CARE_PROVIDER_SITE_OTHER): Payer: 59 | Admitting: Family Medicine

## 2022-09-02 VITALS — BP 106/71 | HR 70 | Temp 98.0°F | Ht 68.5 in | Wt 172.6 lb

## 2022-09-02 DIAGNOSIS — R059 Cough, unspecified: Secondary | ICD-10-CM

## 2022-09-02 MED ORDER — AZITHROMYCIN 250 MG PO TABS: ORAL_TABLET | ORAL | 0 refills | Status: DC

## 2022-09-02 MED ORDER — AZELASTINE HCL 0.1 % NA SOLN: 2.0000 | Freq: Two times a day (BID) | NASAL | 12 refills | Status: DC

## 2022-09-02 NOTE — Telephone Encounter (Signed)
Pt is scheduled 09/02/22  Patient Name: Jordan Huber Gender: Male DOB: [birth year] Age: 42 Y 33 M 25 D Return Phone Number: [phone] (Primary) Address: City/ State/ Zip: [place]  [address] Client [facility] Client Site [facility] Provider Dimas Chyle- MD Contact Type Call Who Is Calling Patient / Member / Family / Caregiver Call Type Triage / Clinical Relationship To Patient Self Return Phone Number [phone] (Primary) Chief Complaint Fever (non-urgent symptom) (greater than THREE MONTHS old) Reason for Call Symptomatic / Request for [name] states he is experiencing a fever of 100.5 with chills. Translation No Nurse Assessment Nurse: Micheline Chapman, RN, Joelene Millin Date/Time (Eastern Time): 09/01/2022 11:57:42 AM Confirm and document reason for call. If symptomatic, describe symptoms. ---Caller states he has a fever 100.5 temporal. Chills, body aches, stuffy nose since Friday. Ibuprofen and Tylenol. Does the patient have any new or worsening symptoms? ---Yes Will a triage be completed? ---Yes Related visit to physician within the last 2 weeks? ---No Does the PT have any chronic conditions? (i.e. diabetes, asthma, this includes High risk factors for pregnancy, etc.) ---No Is this a behavioral health or substance abuse call? ---No Guidelines Guideline Title Affirmed Question Affirmed Notes Nurse Date/Time (Eastern Time) Common Cold Fever present > 3 days (72 hours) Micheline Chapman, RN, Joelene Millin 09/01/2022 11:59:47 AM Disp. Time Eilene Ghazi Time) Disposition Final User 09/01/2022 12:03:30 PM See PCP within 24 Hours Yes Micheline Chapman, RN, Joelene Millin Final Disposition 09/01/2022 12:03:30 PM See PCP within 24 Hours Yes Micheline Chapman, RN, Renea Ee Disagree/Comply Comply Caller Understands Yes PreDisposition Home Care Care Advice Given Per Guideline SEE PCP WITHIN 24 HOURS: *  IF OFFICE WILL BE OPEN: You need to be examined within the next 24 hours. Call your doctor (or NP/PA) when the office opens and make an appointment. CALL BACK IF: * Fever over 104 F (40 C) * You have difficulty breathing * You become worse CARE ADVICE given per Common Cold (Adult) guideline. After Care Instructions Given Call Event Type User Date / Time Description Education document email Jaynie Collins 09/01/2022 12:03:20 PM Common Cold Education document email Micheline Chapman, RN, Joelene Millin 09/01/2022 12:03:20 PM Cough Education document email Micheline Chapman, RN, Joelene Millin 09/01/2022 12:03:20 PM Fever Comments User: Eileen Stanford, RN Date/Time (Eastern Time): 09/01/2022 12:10:15 PM Transferred pt to hold on client line to sched appt. Referrals REFERRED TO PCP OFFICE

## 2022-09-02 NOTE — Progress Notes (Signed)
   Jordan Huber is a 42 y.o. male who presents today for an office visit.  Assessment/Plan:  Cough No red flags.  Likely viral URI.  Start Astelin nasal spray.  Will give pocket prescription for azithromycin.  Encouraged hydration.  He can use over-the-counter meds as needed.  He will let me know if not improving.  Follow-up as needed.     Subjective:  HPI:  Patient here with cough and congestion. Started about 4 days ago. Symptoms include body aches, fevers, chills, temperature to 103F. Took a covid test yesterday was negative. Had several sick family members the last week or so. Some chest congestion. No difficulty breathing. Symptoms stable over the last couple of days. Tylenol seems to help.        Objective:  Physical Exam: BP 106/71   Pulse 70   Temp 98 F (36.7 C) (Temporal)   Ht 5' 8.5" (1.74 m)   Wt 172 lb 9.6 oz (78.3 kg)   SpO2 100%   BMI 25.86 kg/m   Gen: No acute distress, resting comfortably HEENT: TMs retracted.  OP erythematous.  Nasal mucosa erythematous and boggy. CV: Regular rate and rhythm with no murmurs appreciated Pulm: Normal work of breathing, clear to auscultation bilaterally with no crackles, wheezes, or rhonchi Neuro: Grossly normal, moves all extremities Psych: Normal affect and thought content      Dovber Ernest M. Jerline Pain, MD 09/02/2022 7:57 AM

## 2022-09-02 NOTE — Patient Instructions (Signed)
It was very nice to see you today!  You have a respiratory infection.  Please make sure that you are getting plenty of fluids and staying well-hydrated.  He has the nasal spray.  Use the antibiotic if needed.  Will see back next week.  Come back sooner if not improving.  Take care, Dr Jerline Pain  PLEASE NOTE:  If you had any lab tests, please let us know if you have not heard back within a few days. You may see your results on mychart before we have a chance to review them but we will give you a call once they are reviewed by [place].   If we ordered any referrals today, please let us know if you have not heard from their office within the next week.   If you had any urgent prescriptions sent in today, please check with the pharmacy within an hour of our visit to make sure the prescription was transmitted appropriately.   Please try these tips to maintain a healthy lifestyle:  Eat at least 3 REAL meals and 1-2 snacks per day.  Aim for no more than 5 hours between eating.  If you eat breakfast, please do so within one hour of getting up.   Each meal should contain half fruits/vegetables, one quarter protein, and one quarter carbs (no bigger than a computer mouse)  Cut down on sweet beverages. This includes juice, soda, and sweet tea.   Drink at least 1 glass of water with each meal and aim for at least 8 glasses per day  Exercise at least 150 minutes every week.

## 2022-09-02 NOTE — Telephone Encounter (Signed)
Pt had OV today

## 2022-09-08 ENCOUNTER — Encounter: Payer: 59 | Admitting: Family Medicine

## 2022-10-01 ENCOUNTER — Encounter: Payer: 59 | Admitting: Family Medicine

## 2022-10-24 ENCOUNTER — Ambulatory Visit (INDEPENDENT_AMBULATORY_CARE_PROVIDER_SITE_OTHER): Payer: 59 | Admitting: Family Medicine

## 2022-10-24 ENCOUNTER — Encounter: Payer: Self-pay | Admitting: Family Medicine

## 2022-10-24 VITALS — BP 104/71 | HR 75 | Temp 97.5°F | Ht 68.5 in | Wt 175.4 lb

## 2022-10-24 DIAGNOSIS — Z13220 Encounter for screening for lipoid disorders: Secondary | ICD-10-CM

## 2022-10-24 DIAGNOSIS — Z0001 Encounter for general adult medical examination with abnormal findings: Secondary | ICD-10-CM

## 2022-10-24 DIAGNOSIS — E785 Hyperlipidemia, unspecified: Secondary | ICD-10-CM | POA: Diagnosis not present

## 2022-10-24 DIAGNOSIS — Z131 Encounter for screening for diabetes mellitus: Secondary | ICD-10-CM

## 2022-10-24 LAB — LIPID PANEL
Cholesterol: 190 mg/dL (ref 0–200)
HDL: 38.7 mg/dL — ABNORMAL LOW (ref >39.00)
LDL Cholesterol: 131 mg/dL — ABNORMAL HIGH (ref 0–99)
NonHDL: 151.36
Total CHOL/HDL Ratio: 5
Triglycerides: 104 mg/dL (ref 0.0–149.0)
VLDL: 20.8 mg/dL (ref 0.0–40.0)

## 2022-10-24 LAB — COMPREHENSIVE METABOLIC PANEL
ALT: 20 U/L (ref 0–53)
AST: 17 U/L (ref 0–37)
Albumin: 4.5 g/dL (ref 3.5–5.2)
Alkaline Phosphatase: 110 U/L (ref 39–117)
BUN: 12 mg/dL (ref 6–23)
CO2: 28 mEq/L (ref 19–32)
Calcium: 9.3 mg/dL (ref 8.4–10.5)
Chloride: 101 mEq/L (ref 96–112)
Creatinine, Ser: 0.94 mg/dL (ref 0.40–1.50)
GFR: 99.72 mL/min (ref >60.00)
Glucose, Bld: 88 mg/dL (ref 70–99)
Potassium: 4.2 mEq/L (ref 3.5–5.1)
Sodium: 137 mEq/L (ref 135–145)
Total Bilirubin: 0.5 mg/dL (ref 0.2–1.2)
Total Protein: 7.5 g/dL (ref 6.0–8.3)

## 2022-10-24 LAB — TSH: TSH: 2.55 u[IU]/mL (ref 0.35–5.50)

## 2022-10-24 LAB — CBC
HCT: 45.3 % (ref 39.0–52.0)
Hemoglobin: 15.4 g/dL (ref 13.0–17.0)
MCHC: 33.9 g/dL (ref 30.0–36.0)
MCV: 83.5 fl (ref 78.0–100.0)
Platelets: 268 10*3/uL (ref 150.0–400.0)
RBC: 5.43 Mil/uL (ref 4.22–5.81)
RDW: 13.4 % (ref 11.5–15.5)
WBC: 6.1 10*3/uL (ref 4.0–10.5)

## 2022-10-24 LAB — HEMOGLOBIN A1C: Hgb A1c MFr Bld: 5.7 % (ref 4.6–6.5)

## 2022-10-24 NOTE — Assessment & Plan Note (Signed)
Check labs.  Discussed lifestyle modifications. 

## 2022-10-24 NOTE — Progress Notes (Signed)
Chief Complaint:  Jordan Huber is a 42 y.o. male who presents today for his annual comprehensive physical exam.    Assessment/Plan:  New/Acute Problems: Knee Pain No red flags.  Reassuring exam today.  Pain is very mild.  May have very slight LCL sprain or IT band syndrome.  Recommended ice to the area before and after exercise.  Also recommended compression.  He can use over-the-counter meds as needed.  He will let us know if not proving.  Chronic Problems Addressed Today: Dyslipidemia Check labs. Discussed lifestyle modifications.   Preventative Healthcare: Check labs today. Due for colonoscopy in 2028.   Patient Counseling(The following topics were reviewed and/or handout was given):  -Nutrition: Stressed importance of moderation in sodium/caffeine intake, saturated fat and cholesterol, caloric balance, sufficient intake of fresh fruits, vegetables, and fiber.  -Stressed the importance of regular exercise.   -Substance Abuse: Discussed cessation/primary prevention of tobacco, alcohol, or other drug use; driving or other dangerous activities under the influence; availability of treatment for abuse.   -Injury prevention: Discussed safety belts, safety helmets, smoke detector, smoking near bedding or upholstery.   -Sexuality: Discussed sexually transmitted diseases, partner selection, use of condoms, avoidance of unintended pregnancy and contraceptive alternatives.   -Dental health: Discussed importance of regular tooth brushing, flossing, and dental visits.  -Health maintenance and immunizations reviewed. Please refer to Health maintenance section.  Return to care in 1 year for next preventative visit.     Subjective:  HPI:  He has no acute complaints today.   He occasionally gets left lateral knee pain when playing pickle ball.  Does not get this with other activities such as treadmill or swimming.  Pain is very mild.  Worse with certain motions.  Lifestyle Diet: Balanced.  Plenty of fruits and vegetables.  Exercise: Pickleball and swimming. Works on treadmill routinely.      10/24/2022    8:01 AM  Depression screen PHQ 2/9  Decreased Interest 0  Down, Depressed, Hopeless 0  PHQ - 2 Score 0    Health Maintenance Due  Topic Date Due   DTaP/Tdap/Td (1 - Tdap) Never done     ROS: Per HPI, otherwise a complete review of systems was negative.   PMH:  The following were reviewed and entered/updated in epic: History reviewed. No pertinent past medical history. 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   No current facility-administered medications for this visit.    Allergies-reviewed and updated No Known Allergies  Social History   Socioeconomic History   Marital status: Married    Spouse name: Not on file   Number of children: 2   Years of education: Not on file   Highest education level: Not on file  Occupational History    Comment: IT software  Tobacco Use   Smoking status: Never   Smokeless tobacco:  Never  Vaping Use   Vaping Use: Never used  Substance and Sexual Activity   Alcohol use: Not Currently   Drug use: No   Sexual activity: Not on file  Other Topics Concern   Not on file  Social History Narrative   Originally from [place]   Social Determinants of Health   Financial Resource Strain: Not on file  Food Insecurity: Not on file  Transportation Needs: Not on file  Physical Activity: Not on file  Stress: Not  on file  Social Connections: Not on file        Objective:  Physical Exam: BP 104/71   Pulse 75   Temp (!) 97.5 F (36.4 C) (Temporal)   Ht 5' 8.5" (1.74 m)   Wt 175 lb 6.4 oz (79.6 kg)   SpO2 98%   BMI 26.28 kg/m   Body mass index is 26.28 kg/m. Wt Readings from Last 3 Encounters:  10/24/22 175 lb 6.4 oz (79.6 kg)  09/02/22 172 lb 9.6 oz (78.3 kg)  09/18/21 173 lb 6 oz (78.6 kg)   Gen: NAD, resting comfortably HEENT: TMs normal bilaterally. OP clear. No thyromegaly noted.  CV: RRR with no murmurs appreciated Pulm: NWOB, CTAB with no crackles, wheezes, or rhonchi GI: Normal bowel sounds present. Soft, Nontender, Nondistended. MSK: no edema, cyanosis, or clubbing noted -  Knee: No deformities.  Nontender to palpation.  Stable to varus and valgus stress.  Anterior and posterior drawer signs negative. Skin: warm, dry Neuro: CN2-12 grossly intact. Strength 5/5 in upper and lower extremities. Reflexes symmetric and intact bilaterally.  Psych: Normal affect and thought content     Henley Blyth M. Jimmey Ralph, MD 10/24/2022 8:18 AM

## 2022-10-24 NOTE — Progress Notes (Signed)
Cholesterol is borderline but about the same as last year.  The rest of his labs are all stable.  He should work on diet and exercise and we can recheck everything in a year or so.  No need to make any medication adjustments at this point.

## 2022-10-24 NOTE — Patient Instructions (Signed)
It was very nice to see you today!  We will check blood work today.  You can try using a compression sleeve and ice to your knee.  Let us know if not improving.  Return in about 1 year (around 10/24/2023) for Annual Physical.   Take care, Dr Jimmey Ralph  PLEASE NOTE:  If you had any lab tests, please let us know if you have not heard back within a few days. You may see your results on mychart before we have a chance to review them but we will give you a call once they are reviewed by [place].   If we ordered any referrals today, please let us know if you have not heard from their office within the next week.   If you had any urgent prescriptions sent in today, please check with the pharmacy within an hour of our visit to make sure the prescription was transmitted appropriately.   Please try these tips to maintain a healthy lifestyle:  Eat at least 3 REAL meals and 1-2 snacks per day.  Aim for no more than 5 hours between eating.  If you eat breakfast, please do so within one hour of getting up.   Each meal should contain half fruits/vegetables, one quarter protein, and one quarter carbs (no bigger than a computer mouse)  Cut down on sweet beverages. This includes juice, soda, and sweet tea.   Drink at least 1 glass of water with each meal and aim for at least 8 glasses per day  Exercise at least 150 minutes every week.     Preventive Care 51-30 Years Old, Male Preventive care refers to lifestyle choices and visits with your health care provider that can promote health and wellness. Preventive care visits are also called wellness exams. What can I expect for my preventive care visit? Counseling During your preventive care visit, your health care provider may ask about your: Medical history, including: Past medical problems. Family medical history. Current health, including: Emotional well-being. Home life and relationship well-being. Sexual activity. Lifestyle, including: Alcohol,  nicotine or tobacco, and drug use. Access to firearms. Diet, exercise, and sleep habits. Safety issues such as seatbelt and bike helmet use. Sunscreen use. Work and work Astronomer. Physical exam Your health care provider will check your: Height and weight. These may be used to calculate your BMI (body mass index). BMI is a measurement that tells if you are at a healthy weight. Waist circumference. This measures the distance around your waistline. This measurement also tells if you are at a healthy weight and may help predict your risk of certain diseases, such as type 2 diabetes and high blood pressure. Heart rate and blood pressure. Body temperature. Skin for abnormal spots. What immunizations do I need?  Vaccines are usually given at various ages, according to a schedule. Your health care provider will recommend vaccines for you based on your age, medical history, and lifestyle or other factors, such as travel or where you work. What tests do I need? Screening Your health care provider may recommend screening tests for certain conditions. This may include: Lipid and cholesterol levels. Diabetes screening. This is done by checking your blood sugar (glucose) after you have not eaten for a while (fasting). Hepatitis B test. Hepatitis C test. HIV (human immunodeficiency virus) test. STI (sexually transmitted infection) testing, if you are at risk. Lung cancer screening. Prostate cancer screening. Colorectal cancer screening. Talk with your health care provider about your test results, treatment options, and if necessary,  the need for more tests. Follow these instructions at home: Eating and drinking  Eat a diet that includes fresh fruits and vegetables, whole grains, lean protein, and low-fat dairy products. Take vitamin and mineral supplements as recommended by your health care provider. Do not drink alcohol if your health care provider tells you not to drink. If you drink  alcohol: Limit how much you have to 0-2 drinks a day. Know how much alcohol is in your drink. In the [place] one drink equals one 12 oz bottle of beer (355 mL), one 5 oz glass of wine (148 mL), or one 1 oz glass of hard liquor (44 mL). Lifestyle Brush your teeth every morning and night with fluoride toothpaste. Floss one time each day. Exercise for at least 30 minutes 5 or more days each week. Do not use any products that contain nicotine or tobacco. These products include cigarettes, chewing tobacco, and vaping devices, such as e-cigarettes. If you need help quitting, ask your health care provider. Do not use drugs. If you are sexually active, practice safe sex. Use a condom or other form of protection to prevent STIs. Take aspirin only as told by your health care provider. Make sure that you understand how much to take and what form to take. Work with your health care provider to find out whether it is safe and beneficial for you to take aspirin daily. Find healthy ways to manage stress, such as: Meditation, yoga, or listening to music. Journaling. Talking to a trusted person. Spending time with friends and family. Minimize exposure to UV radiation to reduce your risk of skin cancer. Safety Always wear your seat belt while driving or riding in a vehicle. Do not drive: If you have been drinking alcohol. Do not ride with someone who has been drinking. When you are tired or distracted. While texting. If you have been using any mind-altering substances or drugs. Wear a helmet and other protective equipment during sports activities. If you have firearms in your house, make sure you follow all gun safety procedures. What's next? Go to your health care provider once a year for an annual wellness visit. Ask your health care provider how often you should have your eyes and teeth checked. Stay up to date on all vaccines. This information is not intended to replace advice given to you by your  health care provider. Make sure you discuss any questions you have with your health care provider. Document Revised: 12/05/2020 Document Reviewed: 12/05/2020 Elsevier Patient Education  [place].

## 2023-08-17 ENCOUNTER — Ambulatory Visit (HOSPITAL_BASED_OUTPATIENT_CLINIC_OR_DEPARTMENT_OTHER)
Admission: RE | Admit: 2023-08-17 | Discharge: 2023-08-17 | Disposition: A | Discharge status: Home / Self Care | Payer: 59 | Source: Ambulatory Visit | Attending: Family Medicine | Admitting: Family Medicine

## 2023-08-17 ENCOUNTER — Encounter: Payer: Self-pay | Admitting: Family Medicine

## 2023-08-17 ENCOUNTER — Ambulatory Visit: Payer: Self-pay | Admitting: Family Medicine

## 2023-08-17 ENCOUNTER — Ambulatory Visit (INDEPENDENT_AMBULATORY_CARE_PROVIDER_SITE_OTHER): Payer: 59 | Admitting: Family Medicine

## 2023-08-17 VITALS — BP 112/72 | HR 86 | Ht 68.5 in | Wt 180.0 lb

## 2023-08-17 DIAGNOSIS — R09A2 Foreign body sensation, throat: Secondary | ICD-10-CM | POA: Insufficient documentation

## 2023-08-17 NOTE — Telephone Encounter (Signed)
 Copied From CRM [id number]. Reason for Triage: feels like something in stuck  throat after eating breakfast on Saturday.Not experiencing breathing issue.do not report any there symptoms  Chief Complaint: Mild difficulty swallowing Symptoms: Sore throat, reflux Frequency: 2 days Pertinent Negatives: Patient denies fever Disposition: [] ED /[] Urgent Care (no appt availability in office) / [x] Appointment(In office/virtual)/ []  [facility]/ [] Home Care/ [] Refused Recommended Disposition /[] [facility]/ []  Follow-up with PCP Additional Notes: Patient called in to report mild difficulty swallowing. Patient stated he noticed the sensation while he was eating bread and scrambled eggs 2 days ago. Patient stated his throat is becoming more uncomfortable. Patient denied difficulty breathing, tongue and lip swelling. Patient stated he is still able to swallow food and water, but needs to clear his throat more frequently. Patient stated he feels reflux in his throat at times. This RN advised patient to see a provider in 24 hours, per protocol. No availability with PCP or in PCP office. This RN scheduled same day appointment with a provider at an alternate office. This RN advised patient to call back if symptoms worsen. Patient complied.   Reason for Disposition  [1] Swallowing difficulty AND [2] cause unknown  (Exception: Difficulty swallowing is a chronic symptom.)  Answer Assessment - Initial Assessment Questions 1. DESCRIPTION: "Tell me more about this problem." "Are you  having trouble swallowing liquids, solids, or both?" "Any trouble with swallowing saliva (spit)?"     States he has been able to drink and eat normally 2. SEVERITY: "How bad is the swallowing difficulty?"  (e.g., Scale 1-10; or mild, moderate, severe)   - MILD (0-3): Occasional swallowing difficulty; has trouble swallowing certain types of foods or liquids.   - MODERATE (4-7): Frequent swallowing difficulty; only able to  swallow small amounts of foods and fluids.   - SEVERE (8-10): Unable to swallow any foods, fluids, or saliva; sensation of "lump in throat" or "something stuck in throat", and frequent drooling or spitting may be present.     Mild-moderated, states he has to clear his throat at times when eating  3. ONSET: "When did the swallowing problems begin?"      2 days ago 4. CAUSE: "What do you think is causing the problem?"  (e.g., dry mouth, food or pill stuck in throat, mouth pain, sore throat, progression of disease process such as dementia or Parkinson's disease).      Eating bread and scrambled eggs 5. CHRONIC or RECURRENT: "Is this a new problem for you?"  If No, ask: "How long have you had this problem?" (e.g., days, weeks, months)      States this is the first time this has happened 6. OTHER SYMPTOMS: "Do you have any other symptoms?" (e.g., chest pain, difficulty breathing, mouth sores, sore throat, swollen tongue, chest pain)     Denies difficulty breathing, denies swelling of mouth and tongue, slight sore throat today  Protocols used: Swallowing Difficulty-A-AH

## 2023-08-17 NOTE — Progress Notes (Signed)
 Acute Office Visit  Subjective:     Patient ID: Jordan Huber, male    DOB: 12-28-1980, 43 y.o.   MRN: [id number]  Chief Complaint  Patient presents with   Sore Throat     Patient is in today for throat discomfort.   Discussed the use of AI scribe software for clinical note transcription with the patient, who gave verbal consent to proceed.  History of Present Illness   Jordan Huber is a 43 year old male who presents with throat discomfort and mild pain.  He has been experiencing throat discomfort for the past three days, which began after eating breakfast. He suspects he may have swallowed a small piece of plastic or foreign body. Since then, he has felt a persistent sensation of something being stuck in his throat, which has been bothersome and occasionally requires him to clear his throat.  He has attempted to alleviate the sensation by eating bananas and drinking juice, tea, and coffee, but these measures have not provided relief.  Initially, there was no pain, but he now reports a mild, persistent pain in the upper throat area, which is present all the time and not just when swallowing. He describes the pain as 'very light' and not sharp, with a sensation of pressure in the front of the neck. No fever, chills, or feeling generally unwell. He has not experienced significant difficulty swallowing or choking on food, although he occasionally feels the need to force saliva down. No trouble breathing or severe symptoms.   He is not currently taking any medications.            ROS All review of systems negative except what is listed in the HPI      Objective:    BP 112/72   Pulse 86   Ht 5' 8.5" (1.74 m)   Wt 180 lb (81.6 kg)   SpO2 99%   BMI 26.97 kg/m    Physical Exam Vitals reviewed.  Constitutional:      General: He is not in acute distress.    Appearance: He is well-developed. He is not ill-appearing.  HENT:     Head: Normocephalic and atraumatic.      Mouth/Throat:     Tonsils: No tonsillar exudate or tonsillar abscesses. 0 on the right. 0 on the left.  Neck:     Thyroid: No thyromegaly.  Lymphadenopathy:     Cervical: No cervical adenopathy.  Skin:    General: Skin is warm.  Neurological:     Mental Status: He is alert and oriented to person, place, and time.     No results found for any visits on 08/17/23.      Assessment & Plan:   Problem List Items Addressed This Visit   None Visit Diagnoses       Foreign body sensation, throat    -  Primary   Relevant Orders   DG Neck Soft Tissue   Ambulatory referral to ENT      Patient reports a sensation of a foreign body in the throat for the past three days, possibly related to ingestion of a small piece of foreign body while eating breakfast. No difficulty swallowing, choking, or respiratory distress. Mild discomfort and pressure sensation in the throat, increasing slightly in intensity over time. -Order neck X-ray to evaluate for possible foreign body. Aware that this may not show translucent objects like plastic, glass, etc. Consider CT if inconclusive and symptoms persist.  -Refer to Ear, Nose, and Throat (  ENT) specialist for further evaluation and possible scope. -Advise patient to gargle with salt water, drink plenty of fluids, and chew food thoroughly. -No severe symptoms currently. Advise patient to seek immediate medical attention if symptoms worsen significantly or if difficulty breathing develops.        No orders of the defined types were placed in this encounter.   Return if symptoms worsen or fail to improve.  Clayborne Dana, NP

## 2023-08-17 NOTE — Telephone Encounter (Signed)
 Noted.

## 2023-08-19 ENCOUNTER — Encounter (INDEPENDENT_AMBULATORY_CARE_PROVIDER_SITE_OTHER): Payer: Self-pay | Admitting: Otolaryngology

## 2023-10-26 ENCOUNTER — Encounter: Payer: 59 | Admitting: Family Medicine

## 2023-11-10 ENCOUNTER — Telehealth: Payer: Self-pay | Admitting: *Deleted

## 2023-11-10 NOTE — Telephone Encounter (Signed)
 Copied from CRM [id number]. Topic: Referral - Request for Referral >> Nov 09, 2023  4:38 PM Dewanda Foots wrote: Did the patient discuss referral with their provider in the last year? Yes (If No - schedule appointment) (If Yes - send message)  Appointment offered? Yes, patient denied scheduling, but would like to schedule again. Type of order/referral and detailed reason for visit: First the ear was getting better but is now painful again in both ears.   Preference of office, provider, location: No preference  If referral order, have you been seen by this specialty before? No (If Yes, this issue or another issue? When? Where?  Can we respond through MyChart? Yes, or phone call [phone]   Patient has an office visit with PCP on 11/12/2023 Will discuss referral on visit day  [facility]

## 2023-11-11 ENCOUNTER — Other Ambulatory Visit: Payer: Self-pay | Admitting: *Deleted

## 2023-11-11 DIAGNOSIS — H9209 Otalgia, unspecified ear: Secondary | ICD-10-CM

## 2023-11-11 NOTE — Telephone Encounter (Signed)
 Ok with me. Please place any necessary orders.

## 2023-11-11 NOTE — Telephone Encounter (Signed)
 ENT referral placed.

## 2023-11-12 ENCOUNTER — Encounter: Payer: Self-pay | Admitting: Family Medicine

## 2023-11-12 ENCOUNTER — Ambulatory Visit (INDEPENDENT_AMBULATORY_CARE_PROVIDER_SITE_OTHER): Admitting: Family Medicine

## 2023-11-12 VITALS — BP 100/65 | HR 83 | Temp 98.1°F | Ht 68.5 in | Wt 176.2 lb

## 2023-11-12 DIAGNOSIS — E785 Hyperlipidemia, unspecified: Secondary | ICD-10-CM

## 2023-11-12 DIAGNOSIS — Z131 Encounter for screening for diabetes mellitus: Secondary | ICD-10-CM

## 2023-11-12 DIAGNOSIS — Z0001 Encounter for general adult medical examination with abnormal findings: Secondary | ICD-10-CM

## 2023-11-12 NOTE — Patient Instructions (Signed)
 It was very nice to see you today!  We will check blood work.  Please try using Flonase for your ear.  You can follow-up with ENT if not improving.  Please work on exercises tresiba will let us  know if not improving.  Please continue to work on diet and exercise.  I will see you back next year for your physical.  Come back sooner if needed.  Return in about 1 year (around 11/11/2024) for Annual Physical.   Take care, Dr Daneil Dunker  PLEASE NOTE:  If you had any lab tests, please let us  know if you have not heard back within a few days. You may see your results on mychart before we have a chance to review them but we will give you a call once they are reviewed by us .   If we ordered any referrals today, please let us  know if you have not heard from their office within the next week.   If you had any urgent prescriptions sent in today, please check with the pharmacy within an hour of our visit to make sure the prescription was transmitted appropriately.   Please try these tips to maintain a healthy lifestyle:  Eat at least 3 REAL meals and 1-2 snacks per day.  Aim for no more than 5 hours between eating.  If you eat breakfast, please do so within one hour of getting up.   Each meal should contain half fruits/vegetables, one quarter protein, and one quarter carbs (no bigger than a computer mouse)  Cut down on sweet beverages. This includes juice, soda, and sweet tea.   Drink at least 1 glass of water with each meal and aim for at least 8 glasses per day  Exercise at least 150 minutes every week.    Preventive Care 44-44 Years Old, Male Preventive care refers to lifestyle choices and visits with your health care provider that can promote health and wellness. Preventive care visits are also called wellness exams. What can I expect for my preventive care visit? Counseling During your preventive care visit, your health care provider may ask about your: Medical history, including: Past  medical problems. Family medical history. Current health, including: Emotional well-being. Home life and relationship well-being. Sexual activity. Lifestyle, including: Alcohol, nicotine or tobacco, and drug use. Access to firearms. Diet, exercise, and sleep habits. Safety issues such as seatbelt and bike helmet use. Sunscreen use. Work and work Astronomer. Physical exam Your health care provider will check your: Height and weight. These may be used to calculate your BMI (body mass index). BMI is a measurement that tells if you are at a healthy weight. Waist circumference. This measures the distance around your waistline. This measurement also tells if you are at a healthy weight and may help predict your risk of certain diseases, such as type 2 diabetes and high blood pressure. Heart rate and blood pressure. Body temperature. Skin for abnormal spots. What immunizations do I need?  Vaccines are usually given at various ages, according to a schedule. Your health care provider will recommend vaccines for you based on your age, medical history, and lifestyle or other factors, such as travel or where you work. What tests do I need? Screening Your health care provider may recommend screening tests for certain conditions. This may include: Lipid and cholesterol levels. Diabetes screening. This is done by checking your blood sugar (glucose) after you have not eaten for a while (fasting). Hepatitis B test. Hepatitis C test. HIV (human immunodeficiency virus) test.  STI (sexually transmitted infection) testing, if you are at risk. Lung cancer screening. Prostate cancer screening. Colorectal cancer screening. Talk with your health care provider about your test results, treatment options, and if necessary, the need for more tests. Follow these instructions at home: Eating and drinking  Eat a diet that includes fresh fruits and vegetables, whole grains, lean protein, and low-fat dairy  products. Take vitamin and mineral supplements as recommended by your health care provider. Do not drink alcohol if your health care provider tells you not to drink. If you drink alcohol: Limit how much you have to 0-2 drinks a day. Know how much alcohol is in your drink. In the [place] one drink equals one 12 oz bottle of beer (355 mL), one 5 oz glass of wine (148 mL), or one 1 oz glass of hard liquor (44 mL). Lifestyle Brush your teeth every morning and night with fluoride toothpaste. Floss one time each day. Exercise for at least 30 minutes 5 or more days each week. Do not use any products that contain nicotine or tobacco. These products include cigarettes, chewing tobacco, and vaping devices, such as e-cigarettes. If you need help quitting, ask your health care provider. Do not use drugs. If you are sexually active, practice safe sex. Use a condom or other form of protection to prevent STIs. Take aspirin only as told by your health care provider. Make sure that you understand how much to take and what form to take. Work with your health care provider to find out whether it is safe and beneficial for you to take aspirin daily. Find healthy ways to manage stress, such as: Meditation, yoga, or listening to music. Journaling. Talking to a trusted person. Spending time with friends and family. Minimize exposure to UV radiation to reduce your risk of skin cancer. Safety Always wear your seat belt while driving or riding in a vehicle. Do not drive: If you have been drinking alcohol. Do not ride with someone who has been drinking. When you are tired or distracted. While texting. If you have been using any mind-altering substances or drugs. Wear a helmet and other protective equipment during sports activities. If you have firearms in your house, make sure you follow all gun safety procedures. What's next? Go to your health care provider once a year for an annual wellness visit. Ask your  health care provider how often you should have your eyes and teeth checked. Stay up to date on all vaccines. This information is not intended to replace advice given to you by your health care provider. Make sure you discuss any questions you have with your health care provider. Document Revised: 12/05/2020 Document Reviewed: 12/05/2020 Elsevier Patient Education  2024 [employer].

## 2023-11-12 NOTE — Assessment & Plan Note (Signed)
 Check labs.  Discussed lifestyle modifications.

## 2023-11-12 NOTE — Progress Notes (Signed)
 Chief Complaint:  Jordan Huber is a 43 y.o. male who presents today for his annual comprehensive physical exam.    Assessment/Plan:  New/Acute Problems: Left ear pain No red flags.  Overall reassuring exam though does have some signs of eustachian tube dysfunction.  Recommended over-the-counter Flonase.  He has already been referred to ENT and will follow-up with them if not improving  Right elbow pain Exam consistent with lateral epicondylitis.  We discussed conservative measures including home exercise program and counterforce bracing.  He can also use ice to the area as needed.  He will let us  know if not improving in the next few weeks.  Chronic Problems Addressed Today: Dyslipidemia Check labs.  Discussed lifestyle modifications.  Preventative Healthcare: Check labs.  Vaccines declined.  Up-to-date on colon cancer screening.  Patient Counseling(The following topics were reviewed and/or handout was given):  -Nutrition: Stressed importance of moderation in sodium/caffeine intake, saturated fat and cholesterol, caloric balance, sufficient intake of fresh fruits, vegetables, and fiber.  -Stressed the importance of regular exercise.   -Substance Abuse: Discussed cessation/primary prevention of tobacco, alcohol, or other drug use; driving or other dangerous activities under the influence; availability of treatment for abuse.   -Injury prevention: Discussed safety belts, safety helmets, smoke detector, smoking near bedding or upholstery.   -Sexuality: Discussed sexually transmitted diseases, partner selection, use of condoms, avoidance of unintended pregnancy and contraceptive alternatives.   -Dental health: Discussed importance of regular tooth brushing, flossing, and dental visits.  -Health maintenance and immunizations reviewed. Please refer to Health maintenance section.  Return to care in 1 year for next preventative visit.     Subjective:  HPI:  He has no acute complaints  today. Patient has had ongoing ear pain for a few months. Located in left ear. No decreased hearing. No treatments tried.   Also has been having some pain in right elbow.  Usually happens when playing pickle ball.  Gets better with rest.  Worse with certain motions.  Lifestyle Diet: Balanced. Plenty of fruits and vegetables.  Exercise: Trying to walk daily.      11/12/2023   10:06 AM  Depression screen PHQ 2/9  Decreased Interest 0  Down, Depressed, Hopeless 0  PHQ - 2 Score 0    There are no preventive care reminders to display for this patient.   ROS: Per HPI, otherwise a complete review of systems was negative.   PMH:  The following were reviewed and entered/updated in epic: History reviewed. No pertinent past medical history. 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   No current facility-administered medications for this visit.    Allergies-reviewed and updated No Known Allergies  Social History   Socioeconomic History   Marital status: Married    Spouse name: Not on file   Number of children: 2   Years of  education: Not on file   Highest education level: Not on file  Occupational History    Comment: IT software  Tobacco Use   Smoking status: Never   Smokeless tobacco: Never  Vaping Use   Vaping status: Never Used  Substance and Sexual Activity   Alcohol use: Not Currently   Drug use: No   Sexual activity: Not on file  Other Topics Concern   Not  on file  Social History Narrative   Originally from [place]   Social Drivers of Health   Financial Resource Strain: Not on file  Food Insecurity: Not on file  Transportation Needs: Not on file  Physical Activity: Not on file  Stress: Not on file  Social Connections: Not on file        Objective:  Physical Exam: BP 100/65   Pulse 83   Temp 98.1 F (36.7 C) (Temporal)   Ht 5' 8.5" (1.74 m)   Wt 176 lb 3.2 oz (79.9 kg)   SpO2 98%   BMI 26.40 kg/m   Body mass index is 26.4 kg/m. Wt Readings from Last 3 Encounters:  11/12/23 176 lb 3.2 oz (79.9 kg)  08/17/23 180 lb (81.6 kg)  10/24/22 175 lb 6.4 oz (79.6 kg)   Gen: NAD, resting comfortably HEENT: Bilateral TM with clear effusion.  No erythema.  OP clear. No thyromegaly noted.  CV: RRR with no murmurs appreciated Pulm: NWOB, CTAB with no crackles, wheezes, or rhonchi GI: Normal bowel sounds present. Soft, Nontender, Nondistended. MSK: no edema, cyanosis, or clubbing noted - Right Arm: No deformities.  Tender patient along lateral epicondyle.  Pain elicited with resisted extension of third digit. Skin: warm, dry Neuro: CN2-12 grossly intact. Strength 5/5 in upper and lower extremities. Reflexes symmetric and intact bilaterally.  Psych: Normal affect and thought content     Clayvon Parlett M. Daneil Dunker, MD 11/12/2023 10:22 AM

## 2023-11-14 LAB — COMPREHENSIVE METABOLIC PANEL WITH GFR
AG Ratio: 1.9 (calc) (ref 1.0–2.5)
ALT: 30 U/L (ref 9–46)
AST: 21 U/L (ref 10–40)
Albumin: 4.5 g/dL (ref 3.6–5.1)
Alkaline phosphatase (APISO): 87 U/L (ref 36–130)
BUN: 12 mg/dL (ref 7–25)
CO2: 27 mmol/L (ref 20–32)
Calcium: 9.1 mg/dL (ref 8.6–10.3)
Chloride: 106 mmol/L (ref 98–110)
Creat: 0.81 mg/dL (ref 0.60–1.29)
Globulin: 2.4 g/dL (ref 1.9–3.7)
Glucose, Bld: 86 mg/dL (ref 65–99)
Potassium: 4.4 mmol/L (ref 3.5–5.3)
Sodium: 141 mmol/L (ref 135–146)
Total Bilirubin: 0.5 mg/dL (ref 0.2–1.2)
Total Protein: 6.9 g/dL (ref 6.1–8.1)
eGFR: 112 mL/min/{1.73_m2} (ref >=60)

## 2023-11-14 LAB — CBC
HCT: 47.1 % (ref 38.5–50.0)
Hemoglobin: 15.3 g/dL (ref 13.2–17.1)
MCH: 28 pg (ref 27.0–33.0)
MCHC: 32.5 g/dL (ref 32.0–36.0)
MCV: 86.1 fL (ref 80.0–100.0)
MPV: 9.5 fL (ref 7.5–12.5)
Platelets: 266 10*3/uL (ref 140–400)
RBC: 5.47 10*6/uL (ref 4.20–5.80)
RDW: 13 % (ref 11.0–15.0)
WBC: 5.3 10*3/uL (ref 3.8–10.8)

## 2023-11-14 LAB — LIPID PANEL
Cholesterol: 223 mg/dL — ABNORMAL HIGH (ref <200)
HDL: 41 mg/dL (ref >=40)
LDL Cholesterol (Calc): 149 mg/dL — ABNORMAL HIGH
Non-HDL Cholesterol (Calc): 182 mg/dL — ABNORMAL HIGH (ref <130)
Total CHOL/HDL Ratio: 5.4 (calc) — ABNORMAL HIGH (ref <5.0)
Triglycerides: 192 mg/dL — ABNORMAL HIGH (ref <150)

## 2023-11-14 LAB — HEMOGLOBIN A1C
Hgb A1c MFr Bld: 5.8 % — ABNORMAL HIGH (ref <5.7)
Mean Plasma Glucose: 120 mg/dL
eAG (mmol/L): 6.6 mmol/L

## 2023-11-14 LAB — TSH: TSH: 1.28 m[IU]/L (ref 0.40–4.50)

## 2023-11-17 ENCOUNTER — Ambulatory Visit: Payer: Self-pay | Admitting: Family Medicine

## 2023-11-17 NOTE — Progress Notes (Signed)
 Cholesterol up a bit.  A1c is slightly up as well.  Do not need to start meds for either of these.  He should continue to work on diet and exercise and we can recheck in 1 year.

## 2023-12-15 ENCOUNTER — Encounter (INDEPENDENT_AMBULATORY_CARE_PROVIDER_SITE_OTHER): Payer: Self-pay

## 2023-12-28 ENCOUNTER — Ambulatory Visit (INDEPENDENT_AMBULATORY_CARE_PROVIDER_SITE_OTHER): Admitting: Physician Assistant

## 2023-12-28 ENCOUNTER — Encounter (INDEPENDENT_AMBULATORY_CARE_PROVIDER_SITE_OTHER): Payer: Self-pay | Admitting: Physician Assistant

## 2023-12-28 VITALS — BP 103/68 | HR 73

## 2023-12-28 DIAGNOSIS — H9392 Unspecified disorder of left ear: Secondary | ICD-10-CM

## 2023-12-28 DIAGNOSIS — H6992 Unspecified Eustachian tube disorder, left ear: Secondary | ICD-10-CM

## 2023-12-28 NOTE — Progress Notes (Signed)
 Dear Dr. Kennyth, Here is my assessment for our mutual patient, Jordan Huber. Thank you for allowing me the opportunity to care for your patient. Please do not hesitate to contact me should you have any other questions. Sincerely, Chyrl Cohen PA-C  Otolaryngology Clinic Note Referring provider: Dr. Kennyth HPI:  Jordan Huber is a 43 y.o. male kindly referred by Dr. Kennyth   The patient is a 43 year old gentleman seen in our office for evaluation of ear fullness.  The patient notes that approximately 7 months ago he started to have fullness in his left ear.  He notes this comes and goes.  He denies any associated pain, hearing loss, drainage, infections, dizziness, or tinnitus.  He notes he was placed on antibiotics but this did not help the symptoms.  He notes very rare popping in the ear.  He notes his symptoms are only in the left ear.  He saw his primary care provider for this, he was placed on Flonase, he notes he tried this twice but did not have improvement of his symptoms so he discontinued using it.  He denies any history of allergies.     Independent Review of Additional Tests or Records:  None   PMH/Meds/All/SocHx/FamHx/ROS:  History reviewed. No pertinent past medical history.   Past Surgical History:  Procedure Laterality Date   RECTAL SURGERY     abscess    Family History  Problem Relation Age of Onset   Healthy Mother    Healthy Father    Healthy Sister    Healthy Brother    Healthy Brother    Colon cancer Neg Hx    Rectal cancer Neg Hx    Stomach cancer Neg Hx    Esophageal cancer Neg Hx    Liver cancer Neg Hx      Social Connections: Not on file     No current outpatient medications on file.   Physical Exam:   BP 103/68   Pulse 73   SpO2 96%   Pertinent Findings  CN II-XII intact Bilateral EAC clear and TM intact with well pneumatized middle ear spaces Weber 512: equal Rinne 512: AC > BC b/l  Anterior rhinoscopy: Septum midline; bilateral  inferior turbinates with no hypertrophy No lesions of oral cavity/oropharynx; dentition within normal limits No obviously neck masses/lymphadenopathy/thyromegaly No respiratory distress or stridor  Seprately Identifiable Procedures:  None  Impression & Plans:  Jordan Huber is a 43 y.o. male with the following   Eustachian tube dysfunction-  43 year old gentleman seen today for evaluation of fullness in the left ear.  This comes and goes, no significant pain, no associated hearing loss or related ear complaints.  I do have suspicion for eustachian tube dysfunction.  I did discuss that he would need to use Flonase more consistently to see if this would help his symptoms.  I would also like him to take a daily antihistamine.  If his symptoms continue to persist I would like to see him back in the office in approximately 3 months, I will see him sooner as needed.  I would also like a audiogram at the time of evaluation.  The patient verbalized understanding and agreement to today's plan had no further questions or concerns.   - f/u 3 months   Thank you for allowing me the opportunity to care for your patient. Please do not hesitate to contact me should you have any other questions.  Sincerely, Chyrl Cohen PA-C [facility] Phone: [phone] Fax: [phone]  12/28/2023, 2:19 PM

## 2024-11-15 ENCOUNTER — Encounter: Admitting: Family Medicine
